# Patient Record
Sex: Male | Born: 1970
Health system: Southern US, Community
[De-identification: ages and names within clinical notes are randomized; demographics above are authoritative.]

## PROBLEM LIST (undated history)

## (undated) DIAGNOSIS — E785 Hyperlipidemia, unspecified: Secondary | ICD-10-CM

## (undated) DIAGNOSIS — R7301 Impaired fasting glucose: Secondary | ICD-10-CM

## (undated) DIAGNOSIS — R718 Other abnormality of red blood cells: Secondary | ICD-10-CM

## (undated) DIAGNOSIS — N529 Male erectile dysfunction, unspecified: Secondary | ICD-10-CM

## (undated) DIAGNOSIS — Z5189 Encounter for other specified aftercare: Secondary | ICD-10-CM

## (undated) DIAGNOSIS — R17 Unspecified jaundice: Secondary | ICD-10-CM

## (undated) HISTORY — DX: Other abnormality of red blood cells: R71.8

## (undated) HISTORY — DX: Unspecified jaundice: R17

## (undated) HISTORY — DX: Hyperlipidemia, unspecified: E78.5

## (undated) HISTORY — DX: Impaired fasting glucose: R73.01

## (undated) HISTORY — DX: Male erectile dysfunction, unspecified: N52.9

## (undated) HISTORY — DX: Encounter for other specified aftercare: Z51.89

---

## 1992-01-02 HISTORY — PX: ORIF FEMUR FRACTURE: SHX2119

## 2000-08-24 ENCOUNTER — Encounter: Payer: Self-pay | Admitting: Emergency Medicine

## 2000-08-24 ENCOUNTER — Emergency Department (HOSPITAL_COMMUNITY): Admission: EM | Admit: 2000-08-24 | Discharge: 2000-08-25 | Payer: Self-pay | Admitting: Emergency Medicine

## 2006-01-01 HISTORY — PX: VASECTOMY: SHX75

## 2007-11-03 ENCOUNTER — Encounter: Admission: RE | Admit: 2007-11-03 | Discharge: 2007-11-03 | Payer: Self-pay | Admitting: Family Medicine

## 2009-06-18 IMAGING — CR DG CHEST 2V
2 series · 2 of 2 positions shown · non-contrast
Comparison: None

CLINICAL DATA: Cough.

CHEST - 2 VIEW

[w chest pa]
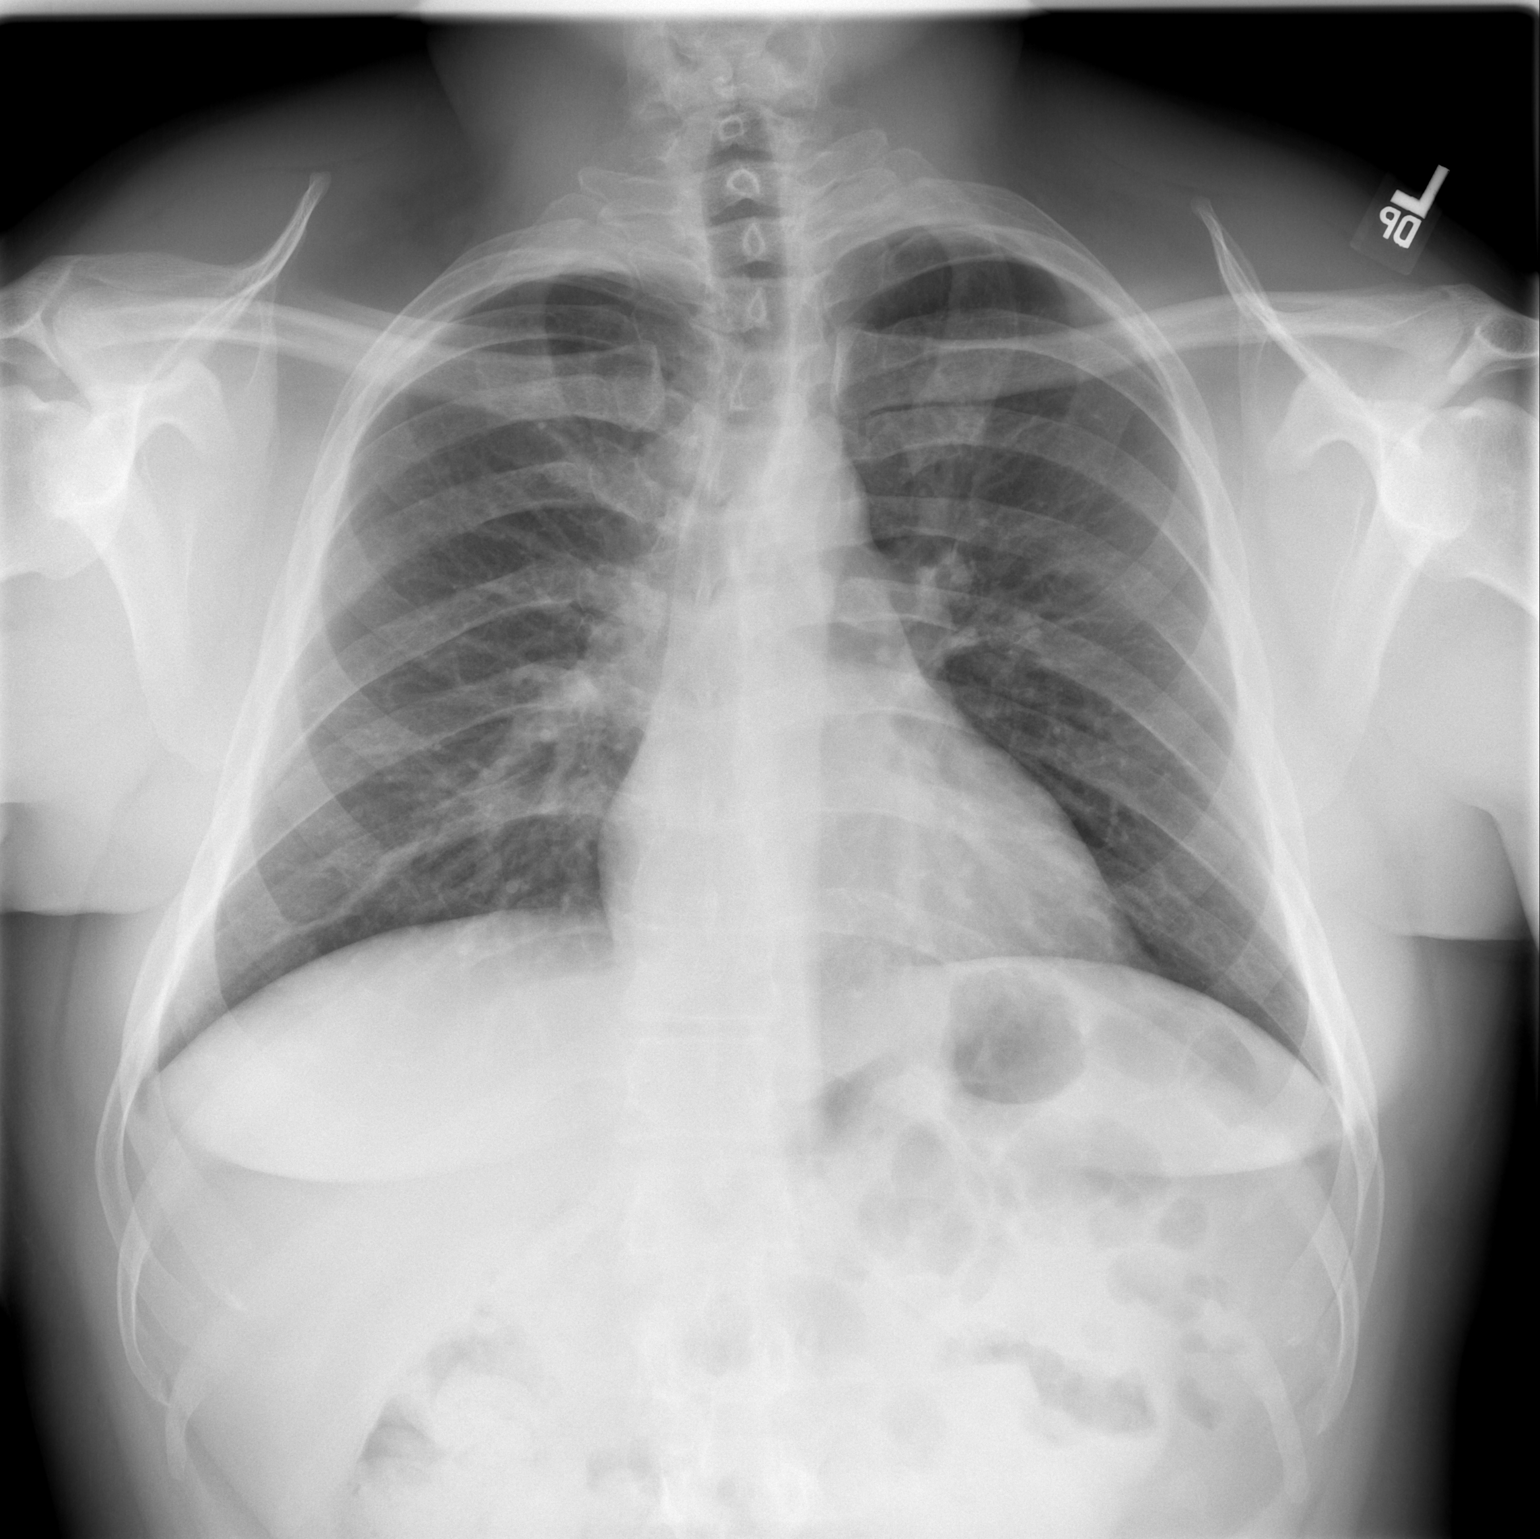

[w chest lat]
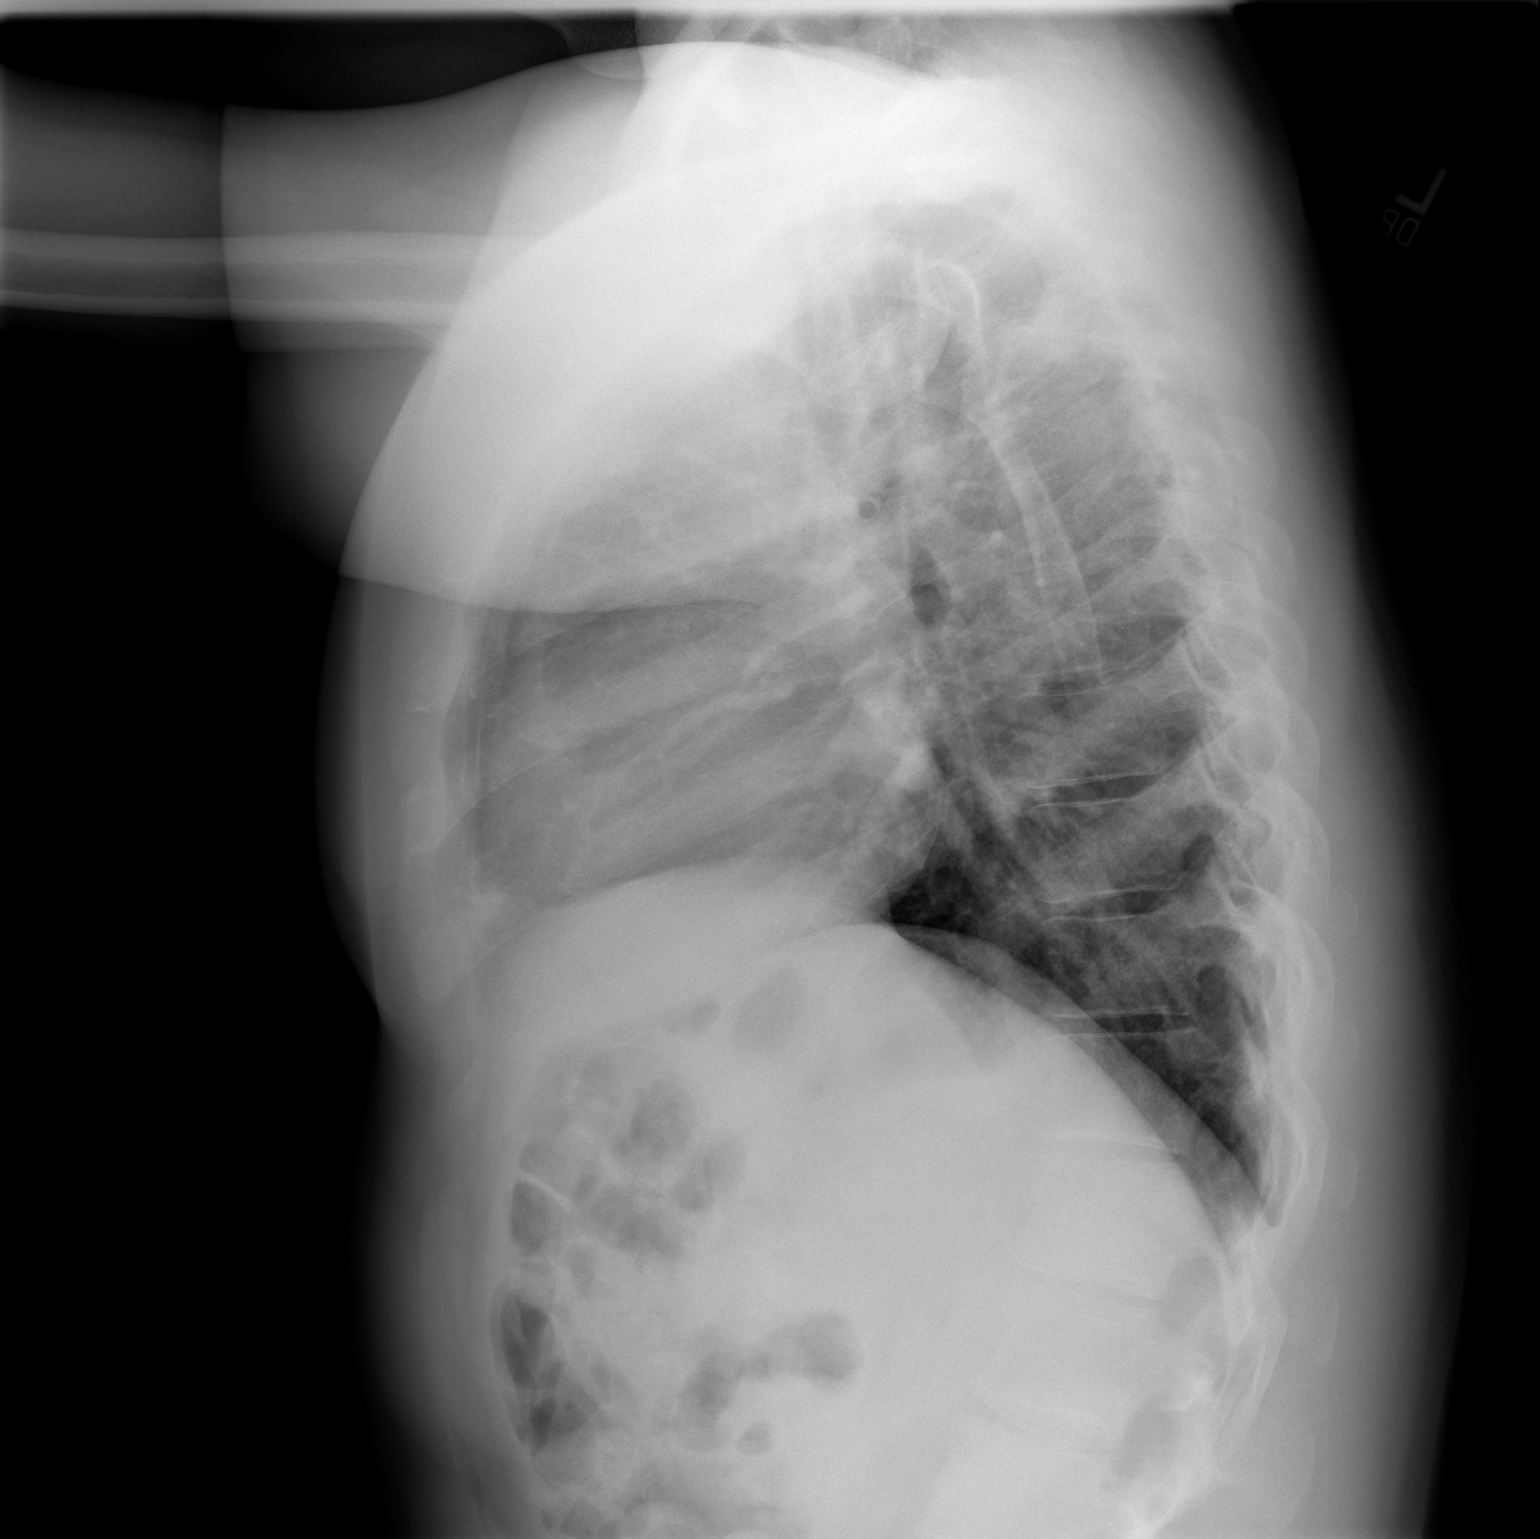

[2 of 2 positions shown; findings below may reference images not displayed]

FINDINGS: Trachea midline.  Heart size normal.  Lungs are clear.
No pleural fluid.
IMPRESSION: No acute findings.

## 2011-03-01 ENCOUNTER — Encounter: Payer: Self-pay | Admitting: Family Medicine

## 2011-03-01 ENCOUNTER — Ambulatory Visit (INDEPENDENT_AMBULATORY_CARE_PROVIDER_SITE_OTHER): Payer: 59 | Admitting: Family Medicine

## 2011-03-01 VITALS — BP 110/70 | HR 60 | Ht 70.5 in | Wt 208.0 lb

## 2011-03-01 DIAGNOSIS — R002 Palpitations: Secondary | ICD-10-CM

## 2011-03-01 DIAGNOSIS — E7800 Pure hypercholesterolemia, unspecified: Secondary | ICD-10-CM

## 2011-03-01 DIAGNOSIS — N529 Male erectile dysfunction, unspecified: Secondary | ICD-10-CM

## 2011-03-01 NOTE — Patient Instructions (Signed)
Cholesterol Control Diet Cholesterol levels in your body are determined significantly by your diet. Cholesterol levels may also be related to heart disease. The following material helps to explain this relationship and discusses what you can do to help keep your heart healthy. Not all cholesterol is bad. Low-density lipoprotein (LDL) cholesterol is the "bad" cholesterol. It may cause fatty deposits to build up inside your arteries. High-density lipoprotein (HDL) cholesterol is "good." It helps to remove the "bad" LDL cholesterol from your blood. Cholesterol is a very important risk factor for heart disease. Other risk factors are high blood pressure, smoking, stress, heredity, and weight. The heart muscle gets its supply of blood through the coronary arteries. If your LDL cholesterol is high and your HDL cholesterol is low, you are at risk for having fatty deposits build up in your coronary arteries. This leaves less room through which blood can flow. Without sufficient blood and oxygen, the heart muscle cannot function properly and you may feel chest pains (angina pectoris). When a coronary artery closes up entirely, a part of the heart muscle may die, causing a heart attack (myocardial infarction). CHECKING CHOLESTEROL When your caregiver sends your blood to a lab to be analyzed for cholesterol, a complete lipid (fat) profile may be done. With this test, the total amount of cholesterol and levels of LDL and HDL are determined. Triglycerides are a type of fat that circulates in the blood and can also be used to determine heart disease risk. The list below describes what the numbers should be: Test: Total Cholesterol.  Less than 200 mg/dl.  Test: LDL "bad cholesterol."  Less than 100 mg/dl.   Less than 70 mg/dl if you are at very high risk of a heart attack or sudden cardiac death.  Test: HDL "good cholesterol."  Greater than 50 mg/dl for women.   Greater than 40 mg/dl for men.  Test:  Triglycerides.  Less than 150 mg/dl.  CONTROLLING CHOLESTEROL WITH DIET Although exercise and lifestyle factors are important, your diet is key. That is because certain foods are known to raise cholesterol and others to lower it. The goal is to balance foods for their effect on cholesterol and more importantly, to replace saturated and trans fat with other types of fat, such as monounsaturated fat, polyunsaturated fat, and omega-3 fatty acids. On average, a person should consume no more than 15 to 17 g of saturated fat daily. Saturated and trans fats are considered "bad" fats, and they will raise LDL cholesterol. Saturated fats are primarily found in animal products such as meats, butter, and cream. However, that does not mean you need to sacrifice all your favorite foods. Today, there are good tasting, low-fat, low-cholesterol substitutes for most of the things you like to eat. Choose low-fat or nonfat alternatives. Choose round or loin cuts of red meat, since these types of cuts are lowest in fat and cholesterol. Chicken (without the skin), fish, veal, and ground turkey breast are excellent choices. Eliminate fatty meats, such as hot dogs and salami. Even shellfish have little or no saturated fat. Have a 3 oz (85 g) portion when you eat lean meat, poultry, or fish. Trans fats are also called "partially hydrogenated oils." They are oils that have been scientifically manipulated so that they are solid at room temperature resulting in a longer shelf life and improved taste and texture of foods in which they are added. Trans fats are found in stick margarine, some tub margarines, cookies, crackers, and baked goods.  When   baking and cooking, oils are an excellent substitute for butter. The monounsaturated oils are especially beneficial since it is believed they lower LDL and raise HDL. The oils you should avoid entirely are saturated tropical oils, such as coconut and palm.  Remember to eat liberally from food  groups that are naturally free of saturated and trans fat, including fish, fruit, vegetables, beans, grains (barley, rice, couscous, bulgur wheat), and pasta (without cream sauces).  IDENTIFYING FOODS THAT LOWER CHOLESTEROL  Soluble fiber may lower your cholesterol. This type of fiber is found in fruits such as apples, vegetables such as broccoli, potatoes, and carrots, legumes such as beans, peas, and lentils, and grains such as barley. Foods fortified with plant sterols (phytosterol) may also lower cholesterol. You should eat at least 2 g per day of these foods for a cholesterol lowering effect.  Read package labels to identify low-saturated fats, trans fats free, and low-fat foods at the supermarket. Select cheeses that have only 2 to 3 g saturated fat per ounce. Use a heart-healthy tub margarine that is free of trans fats or partially hydrogenated oil. When buying baked goods (cookies, crackers), avoid partially hydrogenated oils. Breads and muffins should be made from whole grains (whole-wheat or whole oat flour, instead of "flour" or "enriched flour"). Buy non-creamy canned soups with reduced salt and no added fats.  FOOD PREPARATION TECHNIQUES  Never deep-fry. If you must fry, either stir-fry, which uses very little fat, or use non-stick cooking sprays. When possible, broil, bake, or roast meats, and steam vegetables. Instead of dressing vegetables with butter or margarine, use lemon and herbs, applesauce and cinnamon (for squash and sweet potatoes), nonfat yogurt, salsa, and low-fat dressings for salads.  LOW-SATURATED FAT / LOW-FAT FOOD SUBSTITUTES Meats / Saturated Fat (g)  Avoid: Steak, marbled (3 oz/85 g) / 11 g   Choose: Steak, lean (3 oz/85 g) / 4 g   Avoid: Hamburger (3 oz/85 g) / 7 g   Choose: Hamburger, lean (3 oz/85 g) / 5 g   Avoid: Ham (3 oz/85 g) / 6 g   Choose: Ham, lean cut (3 oz/85 g) / 2.4 g   Avoid: Chicken, with skin, dark meat (3 oz/85 g) / 4 g   Choose: Chicken,  skin removed, dark meat (3 oz/85 g) / 2 g   Avoid: Chicken, with skin, light meat (3 oz/85 g) / 2.5 g   Choose: Chicken, skin removed, light meat (3 oz/85 g) / 1 g  Dairy / Saturated Fat (g)  Avoid: Whole milk (1 cup) / 5 g   Choose: Low-fat milk, 2% (1 cup) / 3 g   Choose: Low-fat milk, 1% (1 cup) / 1.5 g   Choose: Skim milk (1 cup) / 0.3 g   Avoid: Hard cheese (1 oz/28 g) / 6 g   Choose: Skim milk cheese (1 oz/28 g) / 2 to 3 g   Avoid: Cottage cheese, 4% fat (1 cup) / 6.5 g   Choose: Low-fat cottage cheese, 1% fat (1 cup) / 1.5 g   Avoid: Ice cream (1 cup) / 9 g   Choose: Sherbet (1 cup) / 2.5 g   Choose: Nonfat frozen yogurt (1 cup) / 0.3 g   Choose: Frozen fruit bar / trace   Avoid: Whipped cream (1 tbs) / 3.5 g   Choose: Nondairy whipped topping (1 tbs) / 1 g  Condiments / Saturated Fat (g)  Avoid: Mayonnaise (1 tbs) / 2 g   Choose: Low-fat mayonnaise (  1 tbs) / 1 g   Avoid: Butter (1 tbs) / 7 g   Choose: Extra light margarine (1 tbs) / 1 g   Avoid: Coconut oil (1 tbs) / 11.8 g   Choose: Olive oil (1 tbs) / 1.8 g   Choose: Corn oil (1 tbs) / 1.7 g   Choose: Safflower oil (1 tbs) / 1.2 g   Choose: Sunflower oil (1 tbs) / 1.4 g   Choose: Soybean oil (1 tbs) / 2.4 g   Choose: Canola oil (1 tbs) / 1 g  Document Released: 12/18/2004 Document Revised: 08/30/2010 Document Reviewed: 06/08/2010 [employer] Patient Information 2012 [employer]. 
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
## 2011-07-11 ENCOUNTER — Ambulatory Visit (INDEPENDENT_AMBULATORY_CARE_PROVIDER_SITE_OTHER): Payer: 59 | Admitting: Family Medicine

## 2011-07-11 ENCOUNTER — Encounter: Payer: Self-pay | Admitting: Family Medicine

## 2011-07-11 VITALS — BP 110/68 | HR 72 | Ht 70.5 in | Wt 215.0 lb

## 2011-07-11 DIAGNOSIS — H0019 Chalazion unspecified eye, unspecified eyelid: Secondary | ICD-10-CM

## 2011-07-11 DIAGNOSIS — H0011 Chalazion right upper eyelid: Secondary | ICD-10-CM

## 2011-07-11 NOTE — Patient Instructions (Addendum)
Chalazion A chalazion is a swelling or hard lump on the eyelid caused by a blocked oil gland. Chalazions may occur on the upper or the lower eyelid.  CAUSES  Oil gland in the eyelid becomes blocked. SYMPTOMS   Swelling or hard lump on the eyelid. This lump may make it hard to see out of the eye.   The swelling may spread to areas around the eye.  TREATMENT   Although some chalazions disappear by themselves in 1 or 2 months, some chalazions may need to be removed.   Medicines to treat an infection may be required.  HOME CARE INSTRUCTIONS   Wash your hands often and dry them with a clean towel. Do not touch the chalazion.   Apply heat to the eyelid several times a day for 10 minutes to help ease discomfort and bring any yellowish white fluid (pus) to the surface. One way to apply heat to a chalazion is to use the handle of a metal spoon.   Hold the handle under hot water until it is hot, and then wrap the handle in paper towels so that the heat can come through without burning your skin.   Hold the wrapped handle against the chalazion and reheat the spoon handle as needed.   Apply heat in this fashion for 10 minutes, 4 times per day.   Return to your caregiver to have the pus removed if it does not break (rupture) on its own.   Do not try to remove the pus yourself by squeezing the chalazion or sticking it with a pin or needle.   Only take over-the-counter or prescription medicines for pain, discomfort, or fever as directed by your caregiver.  SEEK IMMEDIATE MEDICAL CARE IF:   You have pain in your eye.   Your vision changes.   The chalazion does not go away.   The chalazion becomes painful, red, or swollen, grows larger, or does not start to disappear after 2 weeks.  MAKE SURE YOU:   Understand these instructions.   Will watch your condition.   Will get help right away if you are not doing well or get worse.  Document Released: 12/16/1999 Document Revised: 12/07/2010  Document Reviewed: 04/04/2009 [facility] Patient Information 2012 [place], [place].   Follow up with eye doctor for removal or drainage of the cyst if getting larger and painful.

## 2011-07-11 NOTE — Progress Notes (Signed)
Chief Complaint  Patient presents with  . Eye Problem    right eyelid has a bump on it x 3 weeks. Not painful or itchy.    HPI: Noticed a lump on his right upper eyelid about 3 weeks ago.  Hasn't increased in size, and denies tenderness or pain.  Denies any drainage, vision changes, itchy or watery eyes.  Wears glasses, no contacts. Denies trauma or injury.  Past Medical History  Diagnosis Date  . Erectile dysfunction    History   Social History  . Marital Status: Married    Spouse Name: N/A    Number of Children: 2  . 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 Smoking status: Never Smoker   . Smokeless tobacco: Never Used  . Alcohol Use: No     sober since 03/26/2008 (after DWI)  . Drug Use: No  . Sexually Active: Yes -- Male partner(s)   Other Topics Concern  . Not on file   Social History Narrative   Separated from wife.  Has kids every other weekend (from 1st marriage).  Lives alone, no pets   Current Outpatient Prescriptions on File Prior to Visit  Medication Sig Dispense Refill  . tadalafil (CIALIS) 5 MG tablet Take 5 mg by mouth daily as needed.       Allergies  Allergen Reactions  . Penicillins Other (See Comments)    Historical, told by parent.   PHYSICAL EXAM: BP 110/68  Pulse 72  Ht 5' 10.5" (1.791 m)  Wt 215 lb (97.523 kg)  BMI 30.41 kg/m2 Well developed, pleasant male, in no distress Right upper eyelid, laterally--there is a firm soft tissue swelling in upper eyelid, no fluctuance.  There appears to be a pustule on the under surface of the eyelid, with small area of redness around the pustule (mucosal surface).  There is no erythema or warmth and minimal soft tissue swelling of lid itself.  Conjunctiva is clear  Neck: No lymphadenopathy  ASSESSMENT/PLAN: 1. Chalazion of right upper eyelid    Warm compresses.   Educated re: signs/symptoms of infection.  If significant  worsening, recommend he f/u with ophtho for treatment (either removal or I&D).  Reassured/educated.  Hyperlipidemia--reminded to follow lowfat diet.  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  If remains elevated, will need to discuss in further detail, and arrange for f/u testing.

## 2011-12-11 ENCOUNTER — Encounter: Payer: Self-pay | Admitting: Family Medicine

## 2011-12-11 ENCOUNTER — Encounter: Payer: 59 | Admitting: Family Medicine

## 2011-12-11 ENCOUNTER — Ambulatory Visit (INDEPENDENT_AMBULATORY_CARE_PROVIDER_SITE_OTHER): Payer: 59 | Admitting: Family Medicine

## 2011-12-11 VITALS — BP 130/82 | HR 80 | Temp 98.1°F | Ht 71.0 in | Wt 218.0 lb

## 2011-12-11 DIAGNOSIS — R55 Syncope and collapse: Secondary | ICD-10-CM | POA: Insufficient documentation

## 2011-12-11 DIAGNOSIS — Z0000 Encounter for general adult medical examination without abnormal findings: Secondary | ICD-10-CM

## 2011-12-11 DIAGNOSIS — Z125 Encounter for screening for malignant neoplasm of prostate: Secondary | ICD-10-CM

## 2011-12-11 DIAGNOSIS — N529 Male erectile dysfunction, unspecified: Secondary | ICD-10-CM

## 2011-12-11 DIAGNOSIS — E7800 Pure hypercholesterolemia, unspecified: Secondary | ICD-10-CM

## 2011-12-11 DIAGNOSIS — Z23 Encounter for immunization: Secondary | ICD-10-CM

## 2011-12-11 LAB — COMPREHENSIVE METABOLIC PANEL
ALT: 14 U/L (ref 0–53)
AST: 15 U/L (ref 0–37)
Albumin: 5 g/dL (ref 3.5–5.2)
Alkaline Phosphatase: 41 U/L (ref 39–117)
BUN: 12 mg/dL (ref 6–23)
CO2: 27 mEq/L (ref 19–32)
Calcium: 9.7 mg/dL (ref 8.4–10.5)
Chloride: 98 mEq/L (ref 96–112)
Creat: 0.96 mg/dL (ref 0.50–1.35)
Glucose, Bld: 93 mg/dL (ref 70–99)
Potassium: 3.9 mEq/L (ref 3.5–5.3)
Sodium: 140 mEq/L (ref 135–145)
Total Bilirubin: 1.7 mg/dL — ABNORMAL HIGH (ref 0.3–1.2)
Total Protein: 7.3 g/dL (ref 6.0–8.3)

## 2011-12-11 LAB — CBC WITH DIFFERENTIAL/PLATELET
Basophils Absolute: 0 10*3/uL (ref 0.0–0.1)
Basophils Relative: 0 % (ref 0–1)
Eosinophils Absolute: 0 10*3/uL (ref 0.0–0.7)
Eosinophils Relative: 0 % (ref 0–5)
HCT: 40.5 % (ref 39.0–52.0)
Hemoglobin: 13.4 g/dL (ref 13.0–17.0)
Lymphocytes Relative: 21 % (ref 12–46)
Lymphs Abs: 2 10*3/uL (ref 0.7–4.0)
MCH: 22.9 pg — ABNORMAL LOW (ref 26.0–34.0)
MCHC: 33.1 g/dL (ref 30.0–36.0)
MCV: 69.3 fL — ABNORMAL LOW (ref 78.0–100.0)
Monocytes Absolute: 0.5 10*3/uL (ref 0.1–1.0)
Monocytes Relative: 6 % (ref 3–12)
Neutro Abs: 7.1 10*3/uL (ref 1.7–7.7)
Neutrophils Relative %: 73 % (ref 43–77)
Platelets: 260 10*3/uL (ref 150–400)
RBC: 5.84 MIL/uL — ABNORMAL HIGH (ref 4.22–5.81)
RDW: 15.5 % (ref 11.5–15.5)
WBC: 9.7 10*3/uL (ref 4.0–10.5)

## 2011-12-11 LAB — POCT URINALYSIS DIPSTICK
Bilirubin, UA: NEGATIVE
Blood, UA: NEGATIVE
Glucose, UA: NEGATIVE
Leukocytes, UA: NEGATIVE
Nitrite, UA: NEGATIVE
Protein, UA: NEGATIVE
Spec Grav, UA: 1.01
Urobilinogen, UA: NEGATIVE
pH, UA: 7

## 2011-12-11 LAB — LIPID PANEL
Cholesterol: 195 mg/dL (ref 0–200)
HDL: 38 mg/dL — ABNORMAL LOW (ref >39)
LDL Cholesterol: 142 mg/dL — ABNORMAL HIGH (ref 0–99)
Total CHOL/HDL Ratio: 5.1 Ratio
Triglycerides: 74 mg/dL (ref <150)
VLDL: 15 mg/dL (ref 0–40)

## 2011-12-11 NOTE — Patient Instructions (Signed)
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
## 2011-12-17 ENCOUNTER — Encounter: Payer: 59 | Admitting: Family Medicine

## 2013-10-05 ENCOUNTER — Ambulatory Visit (INDEPENDENT_AMBULATORY_CARE_PROVIDER_SITE_OTHER): Payer: 59 | Admitting: Family Medicine

## 2013-10-05 ENCOUNTER — Encounter: Payer: Self-pay | Admitting: Family Medicine

## 2013-10-05 VITALS — BP 106/60 | HR 60 | Ht 70.5 in | Wt 204.0 lb

## 2013-10-05 DIAGNOSIS — E785 Hyperlipidemia, unspecified: Secondary | ICD-10-CM | POA: Insufficient documentation

## 2013-10-05 DIAGNOSIS — E784 Other hyperlipidemia: Secondary | ICD-10-CM

## 2013-10-05 DIAGNOSIS — R718 Other abnormality of red blood cells: Secondary | ICD-10-CM

## 2013-10-05 DIAGNOSIS — R17 Unspecified jaundice: Secondary | ICD-10-CM

## 2013-10-05 NOTE — Patient Instructions (Signed)
Your total bilirubin is mildly elevated.  There was no significant change from 12/2011 to 01/2013.  I suspect Gilbert's syndrome.  To confirm, would need to check unconjugated vs conjugated bilirubin--see if perhaps this can be done with your upcoming labs in January. (see elevated unconjugated bilirubin with Gilbert's).  This is a benign condition without any significant consequences.  Your bilirubin will ALWAYS be mildly elevated on tests.  Your blood sugar was borderline. Avoid excess sweets and sugar.  Continue regular exercise.  Your HDL remained a little low, while your LDL (bad kind) was improved.  Continue a low cholesterol diet.  Continue daily exercise.  Consider omega-3 fish oil 3000-4000mg /day.  Here are your labs from 12/2011 for comparison.  Lab Results  Component Value Date   WBC 9.7 12/11/2011   HGB 13.4 12/11/2011   HCT 40.5 12/11/2011   MCV 69.3* 12/11/2011   PLT 260 12/11/2011   Lab Results  Component Value Date   CHOL 195 12/11/2011   HDL 38* 12/11/2011   LDLCALC 142* 12/11/2011   TRIG 74 12/11/2011   CHOLHDL 5.1 12/11/2011     Chemistry      Component Value Date/Time   NA 140 12/11/2011 1512   K 3.9 12/11/2011 1512   CL 98 12/11/2011 1512   CO2 27 12/11/2011 1512   BUN 12 12/11/2011 1512   CREATININE 0.96 12/11/2011 1512      Component Value Date/Time   CALCIUM 9.7 12/11/2011 1512   ALKPHOS 41 12/11/2011 1512   AST 15 12/11/2011 1512   ALT 14 12/11/2011 1512   BILITOT 1.7* 12/11/2011 1512     Glucose 93
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
## 2013-10-06 ENCOUNTER — Encounter: Payer: Self-pay | Admitting: Family Medicine

## 2013-10-19 ENCOUNTER — Ambulatory Visit (INDEPENDENT_AMBULATORY_CARE_PROVIDER_SITE_OTHER): Payer: Self-pay | Admitting: Medical

## 2013-10-19 ENCOUNTER — Encounter: Payer: Self-pay | Admitting: Medical

## 2013-10-19 VITALS — BP 120/70 | HR 60 | Temp 98.6°F | Resp 16 | Ht 72.0 in | Wt 208.0 lb

## 2013-10-19 DIAGNOSIS — Z008 Encounter for other general examination: Secondary | ICD-10-CM

## 2013-10-19 DIAGNOSIS — Z0289 Encounter for other administrative examinations: Secondary | ICD-10-CM

## 2013-10-19 LAB — POCT URINALYSIS DIPSTICK
BILIRUBIN UA: NEGATIVE
Blood, UA: NEGATIVE
GLUCOSE UA: NEGATIVE
Ketones, UA: NEGATIVE
Leukocytes, UA: NEGATIVE
NITRITE UA: NEGATIVE
Protein, UA: NEGATIVE
Spec Grav, UA: 1.01
UROBILINOGEN UA: NEGATIVE
pH, UA: 6
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
## 2013-12-17 ENCOUNTER — Ambulatory Visit (INDEPENDENT_AMBULATORY_CARE_PROVIDER_SITE_OTHER): Payer: 59 | Admitting: Family Medicine

## 2013-12-17 ENCOUNTER — Encounter: Payer: Self-pay | Admitting: Family Medicine

## 2013-12-17 VITALS — BP 120/72 | HR 68 | Temp 98.1°F | Ht 71.0 in | Wt 206.0 lb

## 2013-12-17 DIAGNOSIS — D72829 Elevated white blood cell count, unspecified: Secondary | ICD-10-CM

## 2013-12-17 DIAGNOSIS — E785 Hyperlipidemia, unspecified: Secondary | ICD-10-CM

## 2013-12-17 NOTE — Patient Instructions (Signed)
Your elevation in white cell count is minimal, likely due to infection (that you may not have really started having symptoms from yet--can be just a simple cold or virus).  I am not too concerned.  Your WBC last year was normal.  You have no current symptoms of infection.  Return in 2-4 weeks to have this repeated to ensure that it is fine.  Your cholesterol was much better--good job.  Keep up the good diet and exercise.

## 2013-12-17 NOTE — Progress Notes (Signed)
Chief Complaint  Patient presents with  . Follow-up    had labs done through employer for annual exam, white count was 12.0, following up today.    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  His physical is scheduled for mid-January.  He got a call that his WBC was high, and to see PCP to discuss labs. His labs were as follows: WBC 12.0, with diff notable only for absolute gran elevated at 8.4, rest normal. MCV low at 69.4 (prev 70) c-met was normal except for T bili 1.3 (was 1.6 last year).  Glucose 92 (down from 100 last year). Chol 157, TG 73, HDL 40, LDL 102, chol/HDL ratio 3.9  He feels well, and is without complaints. Denies fevers, chills.  Denies URI symptoms.  Denies dysuria, penile discharge, lesions Denies skin lesions, rashes, sores. Denies nausea, vomiting, diarrhea, abdominal pain Denies back pain, joint pains, muscle aches.  PMH, PSH, SH reviewed. Outpatient Encounter Prescriptions as of 12/17/2013  Medication Sig  . tadalafil (CIALIS) 5 MG tablet Take 5 mg by mouth daily as needed.   Allergies  Allergen Reactions  . Penicillins Other (See Comments)    Historical, told by parent.   ROS:  See above  PHYSICAL EXAM: BP 120/72 mmHg  Pulse 68  Temp(Src) 98.1 F (36.7 C) (Tympanic)  Ht 5' 11"  (1.803 m)  Wt 206 lb (93.441 kg)  BMI 28.74 kg/m2 Well developed, pleasant male in no distress HEENT: PERRL, EOMI, conjunctiva clear. TM's and EAC's normal. OP is clear.  Sinuses nontender Neck: no lymphadenopathy or mass Heart: regular rate and rhythm without murmur Lungs: clear bilaterally Back: no CVA tenderness or spinal tenderness Abdomen: soft, nontender, no organomegaly or mass Extremities: no edema Skin: no rashes Psych: normal mood, affect, hygiene and grooming Neuro: alert and oriented. Normal cranial nerves, strength, gait  ASSESSMENT/PLAN:  Elevated WBC count - Plan: CBC with Differential  Dyslipidemia  - improved with diet/exercise  Reviewed current labs and compared to prior. Lipids are improved (ratio is now <4, LDL better) Last year WBC was normal.  Low MCV is stable,  T bili was a little higher last year--known elevation/Gilbert's;stable  Likely elevated WBC is related to mild infection, and not anything worrisome.  Reassured.  Recheck CBC in 3-4 weeks (when WELL)

## 2013-12-29 ENCOUNTER — Encounter: Payer: Self-pay | Admitting: Family Medicine

## 2014-01-06 ENCOUNTER — Other Ambulatory Visit: Payer: 59

## 2014-01-06 DIAGNOSIS — D72829 Elevated white blood cell count, unspecified: Secondary | ICD-10-CM

## 2014-01-07 LAB — CBC WITH DIFFERENTIAL/PLATELET
BASOS PCT: 0 % (ref 0–1)
Basophils Absolute: 0 10*3/uL (ref 0.0–0.1)
EOS ABS: 0 10*3/uL (ref 0.0–0.7)
EOS PCT: 0 % (ref 0–5)
HEMATOCRIT: 39.9 % (ref 39.0–52.0)
Hemoglobin: 13.1 g/dL (ref 13.0–17.0)
Lymphocytes Relative: 37 % (ref 12–46)
Lymphs Abs: 2 10*3/uL (ref 0.7–4.0)
MCH: 23.2 pg — AB (ref 26.0–34.0)
MCHC: 32.8 g/dL (ref 30.0–36.0)
MCV: 70.6 fL — ABNORMAL LOW (ref 78.0–100.0)
MONO ABS: 0.3 10*3/uL (ref 0.1–1.0)
MONOS PCT: 6 % (ref 3–12)
MPV: 9.2 fL (ref 8.6–12.4)
NEUTROS PCT: 57 % (ref 43–77)
Neutro Abs: 3.1 10*3/uL (ref 1.7–7.7)
Platelets: 265 10*3/uL (ref 150–400)
RBC: 5.65 MIL/uL (ref 4.22–5.81)
RDW: 16.2 % — AB (ref 11.5–15.5)
WBC: 5.5 10*3/uL (ref 4.0–10.5)

## 2014-10-27 ENCOUNTER — Encounter: Payer: Self-pay | Admitting: Family Medicine

## 2014-10-27 ENCOUNTER — Ambulatory Visit (INDEPENDENT_AMBULATORY_CARE_PROVIDER_SITE_OTHER): Payer: Commercial Managed Care - HMO | Admitting: Family Medicine

## 2014-10-27 VITALS — BP 118/68 | HR 60 | Ht 70.5 in | Wt 214.0 lb

## 2014-10-27 DIAGNOSIS — D509 Iron deficiency anemia, unspecified: Secondary | ICD-10-CM | POA: Diagnosis not present

## 2014-10-27 DIAGNOSIS — E785 Hyperlipidemia, unspecified: Secondary | ICD-10-CM

## 2014-10-27 DIAGNOSIS — Z23 Encounter for immunization: Secondary | ICD-10-CM

## 2014-10-27 LAB — CBC WITH DIFFERENTIAL/PLATELET
BASOS ABS: 0 10*3/uL (ref 0.0–0.1)
Basophils Relative: 0 % (ref 0–1)
EOS PCT: 1 % (ref 0–5)
Eosinophils Absolute: 0.1 10*3/uL (ref 0.0–0.7)
HEMATOCRIT: 40.3 % (ref 39.0–52.0)
HEMOGLOBIN: 13.3 g/dL (ref 13.0–17.0)
LYMPHS ABS: 2.4 10*3/uL (ref 0.7–4.0)
LYMPHS PCT: 39 % (ref 12–46)
MCH: 23 pg — ABNORMAL LOW (ref 26.0–34.0)
MCHC: 33 g/dL (ref 30.0–36.0)
MCV: 69.7 fL — AB (ref 78.0–100.0)
MONO ABS: 0.4 10*3/uL (ref 0.1–1.0)
MPV: 9.9 fL (ref 8.6–12.4)
Monocytes Relative: 7 % (ref 3–12)
Neutro Abs: 3.2 10*3/uL (ref 1.7–7.7)
Neutrophils Relative %: 53 % (ref 43–77)
Platelets: 257 10*3/uL (ref 150–400)
RBC: 5.78 MIL/uL (ref 4.22–5.81)
RDW: 14.6 % (ref 11.5–15.5)
WBC: 6.1 10*3/uL (ref 4.0–10.5)

## 2014-10-27 LAB — IRON: IRON: 107 ug/dL (ref 50–180)

## 2014-10-27 NOTE — Progress Notes (Signed)
Chief Complaint  Patient presents with  . Follow-up    had labs through employer, 10/06/14 and hgb was 12.8, employee health nurse rec that he follow up on these.    Patient brings in copies of labs done for work from 10/06/2014. Hg 12.8, Hct 39.2, MCV 68.8, plt 262.  c-met was normal except for Tbili 1.8 (has been elevated in the past, we suspected Gilbert's). Chol 177, TG 65, HDL 40, LDL 124, chol/HDL ratio 4.4  He reports that his mother also has anemia (she is in her 58's); unsure of cause. Not being treated for it, but thinks she has taken iron in the past.  He denies any bleeding, bruising, GI complaints, dizziness, fatigue or other complaints.  He also shows me copy of spirometry report (which he gets every other year). Spirometry showed mild restriction (FVC 78% predicted, FEV1 76%, FEV1/FVC 97%. He denies SOB or any chest pressure or symptoms. He has this done every other year at work.  Nothing was mentioned to him, he didn't bring prior studies or know if there was any change.  PMH, PSH, SH reviewed and updated. Social History   Social History  . Marital Status: Married    Spouse Name: N/A  . Number of Children: 2  . Years of Education: N/A   Occupational History  . IT trainer    Social History Main Topics  . Smoking status: Never Smoker   . Smokeless tobacco: Never Used  . Alcohol Use: No     Comment: sober since 03/26/2008 (after DWI)  . Drug Use: No  . Sexual Activity:    Partners: Female    Patent examiner Protection: Surgical     Comment: vasectomy   Other Topics Concern  . Not on file   Social History Narrative   Engaged to be married (10/2014), no date set. Living with fiancee (moved here from [place]). Divorced from 2nd wife. He re-married to his first wife (mother of his 2 children), but then again got divorced.  Lives alone.  Has his 2 girls every other weekend   Outpatient Encounter Prescriptions as of 10/27/2014  Medication Sig  . tadalafil  (CIALIS) 5 MG tablet Take 5 mg by mouth daily as needed.   No facility-administered encounter medications on file as of 10/27/2014.   Allergies  Allergen Reactions  . Penicillins Other (See Comments)    Historical, told by parent.   ROS:  No fever, chills, headaches, dizziness, fatigue, URI symptoms, cough, shortness of breath, chest pain, palpitations, GI or GU complaints, bleeding, bruising, rash, depression or other complaints.  PHYSICAL EXAM: BP 118/68 mmHg  Pulse 60  Ht 5' 10.5" (1.791 m)  Wt 214 lb (97.07 kg)  BMI 30.26 kg/m2  Well developed, pleasant male in no distress Exam was limited to review of labs, counseling/discussion  Comparison to prior CBC's  Lab Results  Component Value Date   WBC 5.5 01/06/2014   HGB 13.1 01/06/2014   HCT 39.9 01/06/2014   MCV 70.6* 01/06/2014   PLT 265 01/06/2014   12/2013: Hg 13.2, MCV 69.4 01/2013: Hg 13.7, MCV 70.5 12/2011: Hg 13.4, MCV 69.3  (12/2013: Lipids: HDL 50, but LDL 102, so ratio 3.9)   ASSESSMENT/PLAN:  Microcytic anemia - Plan: CBC with Differential/Platelet, Ferritin, Iron  Need for prophylactic vaccination and inoculation against influenza - Plan: Flu Vaccine QUAD 36+ mos PF IM (Fluarix & Fluzone Quad PF)    Microcytosis with a very mild anemia.  Review of chart shows consistent  microcytosis dating back to first available labs in 2013.  The Hg had always been >13--this is the first time it is <13, only slightly so.  Recheck CBC along with iron and ferritin.  If iron studies are normal, then suspect a thalassemia, and no further evaluation is needed.  If there is iron deficiency, further evaluation is indicated.  Mild dyslipidemia--chol/HDL ratio >4, whereas it previously had been lower.  Change is related to 20 pt increase in LDL since last check. Reviewed low cholesterol diet. Continue regular exercise.  30 min visit ,more than 1/2 spent counseling.

## 2014-10-27 NOTE — Patient Instructions (Signed)
We are checking your blood to see if there is any iron deficiency. If the tests DO show iron deficiency, then you need to follow the directions and return the hemoccult kit (kit to detect microscopic blood losses from the GI tract). If the tests are normal, you don't need to return them.  We will be in touch with the results soon.

## 2014-10-28 ENCOUNTER — Encounter: Payer: Self-pay | Admitting: Family Medicine

## 2014-10-28 LAB — FERRITIN: Ferritin: 291 ng/mL (ref 22–322)

## 2014-11-02 ENCOUNTER — Encounter: Payer: Self-pay | Admitting: Family Medicine

## 2015-09-22 ENCOUNTER — Encounter: Payer: Self-pay | Admitting: Medical

## 2015-09-22 ENCOUNTER — Ambulatory Visit (INDEPENDENT_AMBULATORY_CARE_PROVIDER_SITE_OTHER): Payer: Self-pay | Admitting: Medical

## 2015-09-22 VITALS — BP 118/80 | HR 68 | Ht 70.5 in | Wt 211.6 lb

## 2015-09-22 DIAGNOSIS — Z0289 Encounter for other administrative examinations: Secondary | ICD-10-CM

## 2015-09-22 LAB — POCT URINALYSIS DIPSTICK
BILIRUBIN UA: NEGATIVE
CLARITY UA: NEGATIVE
Glucose, UA: NEGATIVE
KETONES UA: NEGATIVE
LEUKOCYTES UA: NEGATIVE
Nitrite, UA: NEGATIVE
PH UA: 6
Protein, UA: NEGATIVE
SPEC GRAV UA: 1.02
Urobilinogen, UA: NEGATIVE
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
## 2015-11-09 ENCOUNTER — Telehealth: Payer: Self-pay | Admitting: Family Medicine

## 2015-11-09 NOTE — Telephone Encounter (Signed)
Pt dropped off lab results for you to go over, put in your folder, pt can be reached at [phone] with any questions, pt had his cpe with the fire dept

## 2015-11-10 NOTE — Telephone Encounter (Signed)
Labs reviewed.  To be scanned in.  Compared to 10/2014--overall stable.  Lipids slightly higher (LDL 133, HDL 44, ratio 4.3).  MCV remains low, but normal Hg

## 2015-12-13 ENCOUNTER — Encounter: Payer: Self-pay | Admitting: Family Medicine

## 2016-04-16 ENCOUNTER — Encounter: Payer: Self-pay | Admitting: Family Medicine

## 2016-04-16 ENCOUNTER — Ambulatory Visit (INDEPENDENT_AMBULATORY_CARE_PROVIDER_SITE_OTHER): Payer: Commercial Managed Care - HMO | Admitting: Family Medicine

## 2016-04-16 VITALS — BP 112/64 | HR 64 | Temp 98.1°F | Ht 70.5 in | Wt 214.8 lb

## 2016-04-16 DIAGNOSIS — H00014 Hordeolum externum left upper eyelid: Secondary | ICD-10-CM

## 2016-04-16 MED ORDER — ERYTHROMYCIN 5 MG/GM OP OINT: 1.0000 "application " | TOPICAL_OINTMENT | Freq: Four times a day (QID) | OPHTHALMIC | 0 refills | Status: DC

## 2016-04-16 NOTE — Patient Instructions (Signed)
Stye A stye is a bump on your eyelid caused by a bacterial infection. A stye can form inside the eyelid (internal stye) or outside the eyelid (external stye). An internal stye may be caused by an infected oil-producing gland inside your eyelid. An external stye may be caused by an infection at the base of your eyelash (hair follicle). Styes are very common. Anyone can get them at any age. They usually occur in just one eye, but you may have more than one in either eye. What are the causes? The infection is almost always caused by bacteria called Staphylococcus aureus. This is a common type of bacteria that lives on your skin. What increases the risk? You may be at higher risk for a stye if you have had one before. You may also be at higher risk if you have:  Diabetes.  Long-term illness.  Long-term eye redness.  A skin condition called seborrhea.  High fat levels in your blood (lipids). What are the signs or symptoms? Eyelid pain is the most common symptom of a stye. Internal styes are more painful than external styes. Other signs and symptoms may include:  Painful swelling of your eyelid.  A scratchy feeling in your eye.  Tearing and redness of your eye.  Pus draining from the stye. How is this diagnosed? Your health care provider may be able to diagnose a stye just by examining your eye. The health care provider may also check to make sure:  You do not have a fever or other signs of a more serious infection.  The infection has not spread to other parts of your eye or areas around your eye. How is this treated? Most styes will clear up in a few days without treatment. In some cases, you may need to use antibiotic drops or ointment to prevent infection. Your health care provider may have to drain the stye surgically if your stye is:  Large.  Causing a lot of pain.  Interfering with your vision. This can be done using a thin blade or a needle. Follow these instructions at  home:  Take medicines only as directed by your health care provider.  Apply a clean, warm compress to your eye for 10 minutes, 4 times a day.  Do not wear contact lenses or eye makeup until your stye has healed.  Do not try to pop or drain the stye. Contact a health care provider if:  You have chills or a fever.  Your stye does not go away after several days.  Your stye affects your vision.  Your eyeball becomes swollen, red, or painful. This information is not intended to replace advice given to you by your health care provider. Make sure you discuss any questions you have with your health care provider. Document Released: 09/27/2004 Document Revised: 08/14/2015 Document Reviewed: 04/03/2013 Elsevier Interactive Patient Education  2017 [employer].

## 2016-04-16 NOTE — Progress Notes (Signed)
Chief Complaint  Patient presents with  . Eye Problem    left eye has a bump on it, woke up about a week ago with it there. No pain or itching. No blurring of vision.    A week ago he woke up and noticed a bump on the left upper eyelid.  Denies any significant pain, but it is red.  He hasn't noticed any particular change over the course of the week--not larger, tender or more red or spreading.  He has been applying warm compresses at night.    Denies vision change, eye pain, crusting or other eye complaints.  Denies any changes to medical, social or family history. 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, [place] reviewed  Current Outpatient Prescriptions on File Prior to Visit  Medication Sig Dispense Refill  . tadalafil (CIALIS) 5 MG tablet Take 5 mg by mouth daily as needed.     No current facility-administered medications on file prior to visit.    Allergies  Allergen Reactions  . Penicillins Other (See Comments)    Historical, told by parent.   ROS: no fever, chills, headaches, URI symptoms, itchy or watery eyes, vision changes, sore throat, cough, shortness of breath, chest pain, GI or other skin complaints.  PHYSICAL EXAM:  BP 112/64 (BP Location: Left Arm, Patient Position: Sitting, Cuff Size: Normal)   Pulse 64   Temp 98.1 F (36.7 C) (Tympanic)   Ht 5' 10.5" (1.791 m)   Wt 214 lb 12.8 oz (97.4 kg)   BMI 30.38 kg/m   Well appearing, pleasant male in no distress HEENT: conjunctiva and sclera are clear.  Left upper eyelid--on the lateral aspect there is a soft tissue swelling that is mildly red.  There is no whitehead or pustule evident.  Inverting the lid--no pustule noted, no erythema. Eyelid/lash margin normal OP clear Neck: No lymphadenopathy or mass Neuro: alert and oriented, cranial nerves intact, normal strength, gait Psych: normal mood, affect, hygiene and grooming  ASSESSMENT/PLAN:  Hordeolum externum left upper eyelid  - Plan: erythromycin ophthalmic ointment   No evidence of cellulitis (s/sx reviewed, immediate eval if occurs). Warm compresses at least QID.  Topical erythro ointment (and can use in eye at night when sleeping (on days when not at fire station, as vision will be blurry if gets a call and has to drive).

## 2016-05-02 ENCOUNTER — Encounter: Payer: Self-pay | Admitting: Family Medicine

## 2016-05-02 ENCOUNTER — Ambulatory Visit (INDEPENDENT_AMBULATORY_CARE_PROVIDER_SITE_OTHER): Payer: Commercial Managed Care - HMO | Admitting: Family Medicine

## 2016-05-02 VITALS — BP 124/72 | HR 72 | Temp 97.8°F | Ht 70.5 in | Wt 213.4 lb

## 2016-05-02 DIAGNOSIS — H0014 Chalazion left upper eyelid: Secondary | ICD-10-CM

## 2016-05-02 NOTE — Patient Instructions (Signed)
We are referring you to an eye doctor for removal/treatment of the chalazion (vs external hordeolum) that hasn't resolved with the conservative measures you have been doing.

## 2016-05-02 NOTE — Progress Notes (Signed)
Chief Complaint  Patient presents with  . Stye    on left eyelid-not getting any better, not worse-no pain, some itching. Using the oinment QID and warm compresses.    He was first seen for eye complaint on 4/16.  He presents today for follow-up, not seeing any improvement . He has been doing warm compresses 4x/day.  Has never been painful.  Hasn't drained.  No change in size. He would like this removed--will be graduating and will have pictures taken.  PMH, PSH, SH reviewed  Outpatient Encounter Prescriptions as of 05/02/2016  Medication Sig  . erythromycin ophthalmic ointment Place 1 application into the left eye 4 (four) times daily. Apply externally 4x/day, apply to eye itself at bedtime (when not driving)  . tadalafil (CIALIS) 5 MG tablet Take 5 mg by mouth daily as needed.   No facility-administered encounter medications on file as of 05/02/2016.    Allergies  Allergen Reactions  . Penicillins Other (See Comments)    Historical, told by parent.   ROS: no fever, chills, URI or allergy symptoms, headaches, vision changes.  Denies other skin concerns.  PHYSICAL EXAM:  BP 124/72 (BP Location: Left Arm, Patient Position: Sitting, Cuff Size: Normal)   Pulse 72   Temp 97.8 F (36.6 C) (Tympanic)   Ht 5' 10.5" (1.791 m)   Wt 213 lb 6.4 oz (96.8 kg)   BMI 30.19 kg/m   Pleasant male in no distress Left eye: Soft tissue swelling in a focal area starting at the lateral third of the left upper eyelid.  There is mild redness just over this area.  No extension of erythema or swelling of remainder of eyelid.  There does appear to be some inflammation/head at the eyelash margin. nontender No fluctuance. Conjunctiva is clear Neck: no lymphadenopathy or mass  ASSESSMENT/PLAN:  Chalazion left upper eyelid - vs external hordeolum.  Not resolving--refer to ophtho for treatment. - Plan: Ambulatory referral to Ophthalmology   Suspect chalazion in that it has never really been tender, not  increasing in size or draining. Appearance is more suggestive of external hordeolum, in that there does seem to inflammation at the eyelash margin.  Refer to ophtho for treatment/excision.

## 2016-05-04 DIAGNOSIS — H00024 Hordeolum internum left upper eyelid: Secondary | ICD-10-CM | POA: Diagnosis not present

## 2016-05-04 HISTORY — PX: CHALAZION EXCISION: SHX213

## 2016-05-09 ENCOUNTER — Encounter: Payer: Self-pay | Admitting: Family Medicine

## 2016-06-06 DIAGNOSIS — H00024 Hordeolum internum left upper eyelid: Secondary | ICD-10-CM | POA: Diagnosis not present

## 2016-06-27 DIAGNOSIS — H00024 Hordeolum internum left upper eyelid: Secondary | ICD-10-CM | POA: Diagnosis not present

## 2016-10-02 DIAGNOSIS — Z23 Encounter for immunization: Secondary | ICD-10-CM | POA: Diagnosis not present

## 2016-11-15 ENCOUNTER — Institutional Professional Consult (permissible substitution): Payer: Commercial Managed Care - HMO | Admitting: Family Medicine

## 2016-11-15 ENCOUNTER — Ambulatory Visit: Payer: 59 | Admitting: Family Medicine

## 2016-11-15 ENCOUNTER — Encounter: Payer: Self-pay | Admitting: Family Medicine

## 2016-11-15 VITALS — BP 114/72 | HR 60 | Ht 70.5 in | Wt 213.4 lb

## 2016-11-15 DIAGNOSIS — E7800 Pure hypercholesterolemia, unspecified: Secondary | ICD-10-CM

## 2016-11-15 DIAGNOSIS — R7301 Impaired fasting glucose: Secondary | ICD-10-CM | POA: Insufficient documentation

## 2016-11-15 NOTE — Progress Notes (Signed)
Chief Complaint  Patient presents with  . Follow-up    had labs done through work, glucose fasting was 108 and was told to see PCP and have repeated. Was told by our upfront staff, Juliann Pulse that he did NOT need to be fasting when he brought this paperwork in.    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  These were fasting labs done 11/08/16.  Total cholesterol 209, HDL 46, LDL 143 (133 last year), TG 96 Fasting glucose 108 Rest of c-met normal (T. Bili 1.3) CBC--Hg 13.7, Hct 43.6, MCV 70.9 (noted to be chronically in this range), normal platelets. PSA 0.4   Denies any changes in his diet in the last year. He dropped drinking sodas 8-12 months ago, just rare ginger ale.  He drinks sweet tea once daily with a meal. He drinks a green tea with ginseng and honey. Occasional dessert, not daily.  Exercises 2-3 times/week for an hour, on his days off. Has a gym at work, doesn't exercise while working (doesn't want to get too fatigued in case there is a call).  Typical diet-- Red meat 1x/week.  3-4 eggs/week, with bacon (on weekends). puts pepperjack cheese on sandwiches. +mayo on sandwiches +chipotle ranch dressing on salads   PMH, PSH, SH reviewed  Outpatient Encounter Medications as of 11/15/2016  Medication Sig  . tadalafil (CIALIS) 5 MG tablet Take 5 mg by mouth daily as needed.  . [DISCONTINUED] erythromycin ophthalmic ointment Place 1 application into the left eye 4 (four) times daily. Apply externally 4x/day, apply to eye itself at bedtime (when not driving)   No facility-administered encounter medications on file as of 11/15/2016.    Allergies  Allergen Reactions  . Penicillins Other (See Comments)    Historical, told by parent.   ROS: no fever, chills, headaches, URI symptoms, numbness, tingling, urinary complaints, chest pain, shortness of breath, edema or other concerns  PHYSICAL EXAM:  BP 114/72   Pulse 60   Ht 5' 10.5" (1.791 m)   Wt  213 lb 6.4 oz (96.8 kg)   BMI 30.19 kg/m   Well appearing, pleasant male in no distress Neck: no lymphadenopathy or mass Heart: regular rate and rhythm, no murmur Lungs: clear bilaterally Psych: normal mood, affect, hygiene and grooming Neuro: alert and oriented, cranial nerves intact, normal gait   ASSESSMENT/PLAN:  Impaired fasting glucose - counseled re: diet, exercise, causes including genetic risks for diabetes. return 2-3 mos for FBG and A1c - Plan: Hemoglobin A1c, Glucose, random  Hypercholesterolemia - gradually worsening.  reviewed low cholesterol diet in detail--to cut back on mayo, creamy dressings, egg yolks, bacon. Return in 2-3 mos for repeat lipids - Plan: Lipid panel   Return 2-3 months for fasting labs  Fasting glucose, A1c and lipid panel  25 min visit, more than 1/2 spent counseling   Send copy of notes and results to Dr. Geoffry Paradise

## 2016-11-15 NOTE — Patient Instructions (Addendum)
Cut back on the sugar in your beverages. Try and cut back on your carbs in your diet--have more fruits and vegetables, fewer fries and chips. Don't use mayo on your sandwiches, cut back on cheese and/or eggs and/or bacon.  Use ground [place] or [place] bacon when able to (has less cholesterol than the beef products). Use vinagrette dressings rather than creamy dressings.  Try and get at least 150 minutes of cardio each week--you may need to add a little on your workdays, doesn't have to be very taxing or hard, can just be walking on a treadmill or gentle biking.  Return in 2-3 months for fasting labs--nothing to eat/drink except for water or black coffee for 8 hours prior to the test.  Prediabetes Eating Plan Prediabetes-also called impaired glucose tolerance or impaired fasting glucose-is a condition that causes blood sugar (blood glucose) levels to be higher than normal. Following a healthy diet can help to keep prediabetes under control. It can also help to lower the risk of type 2 diabetes and heart disease, which are increased in people who have prediabetes. Along with regular exercise, a healthy diet:  Promotes weight loss.  Helps to control blood sugar levels.  Helps to improve the way that the body uses insulin.  What do I need to know about this eating plan?  Use the glycemic index (GI) to plan your meals. The index tells you how quickly a food will raise your blood sugar. Choose low-GI foods. These foods take a longer time to raise blood sugar.  Pay close attention to the amount of carbohydrates in the food that you eat. Carbohydrates increase blood sugar levels.  Keep track of how many calories you take in. Eating the right amount of calories will help you to achieve a healthy weight. Losing about 7 percent of your starting weight can help to prevent type 2 diabetes.  You may want to follow a Mediterranean diet. This diet includes a lot of vegetables, lean meats or fish, whole  grains, fruits, and healthy oils and fats. What foods can I eat? Grains Whole grains, such as whole-wheat or whole-grain breads, crackers, cereals, and pasta. Unsweetened oatmeal. Bulgur. Barley. Quinoa. Brown rice. Corn or whole-wheat flour tortillas or taco shells. Vegetables Lettuce. Spinach. Peas. Beets. Cauliflower. Cabbage. Broccoli. Carrots. Tomatoes. Squash. Eggplant. Herbs. Peppers. Onions. Cucumbers. Brussels sprouts. Fruits Berries. Bananas. Apples. Oranges. Grapes. Papaya. Mango. Pomegranate. Kiwi. Grapefruit. Cherries. Meats and Other Protein Sources Seafood. Lean meats, such as chicken and [place] or lean cuts of pork and beef. Tofu. Eggs. Nuts. Beans. Dairy Low-fat or fat-free dairy products, such as yogurt, cottage cheese, and cheese. Beverages Water. Tea. Coffee. Sugar-free or diet soda. Seltzer water. Milk. Milk alternatives, such as soy or almond milk. Condiments Mustard. Relish. Low-fat, low-sugar ketchup. Low-fat, low-sugar barbecue sauce. Low-fat or fat-free mayonnaise. Sweets and Desserts Sugar-free or low-fat pudding. Sugar-free or low-fat ice cream and other frozen treats. Fats and Oils Avocado. Walnuts. Olive oil. The items listed above may not be a complete list of recommended foods or beverages. Contact your dietitian for more options. What foods are not recommended? Grains Refined white flour and flour products, such as bread, pasta, snack foods, and cereals. Beverages Sweetened drinks, such as sweet iced tea and soda. Sweets and Desserts Baked goods, such as cake, cupcakes, pastries, cookies, and cheesecake. The items listed above may not be a complete list of foods and beverages to avoid. Contact your dietitian for more information. This information is not intended to replace  advice given to you by your health care provider. Make sure you discuss any questions you have with your health care provider. Document Released: 05/04/2014 Document Revised:  05/26/2015 Document Reviewed: 01/13/2014 Elsevier Interactive Patient Education  2017 [employer]   Fat and Cholesterol Restricted Diet Getting too much fat and cholesterol in your diet may cause health problems. Following this diet helps keep your fat and cholesterol at normal levels. This can keep you from getting sick. What types of fat should I choose?  Choose monosaturated and polyunsaturated fats. These are found in foods such as olive oil, canola oil, flaxseeds, walnuts, almonds, and seeds.  Eat more omega-3 fats. Good choices include salmon, mackerel, sardines, tuna, flaxseed oil, and ground flaxseeds.  Limit saturated fats. These are in animal products such as meats, butter, and cream. They can also be in plant products such as palm oil, palm kernel oil, and coconut oil.  Avoid foods with partially hydrogenated oils in them. These contain trans fats. Examples of foods that have trans fats are stick margarine, some tub margarines, cookies, crackers, and other baked goods. What general guidelines do I need to follow?  Check food labels. Look for the words "trans fat" and "saturated fat."  When preparing a meal: ? Fill half of your plate with vegetables and green salads. ? Fill one fourth of your plate with whole grains. Look for the word "whole" as the first word in the ingredient list. ? Fill one fourth of your plate with lean protein foods.  Eat more foods that have fiber, like apples, carrots, beans, peas, and barley.  Eat more home-cooked foods. Eat less at restaurants and buffets.  Limit or avoid alcohol.  Limit foods high in starch and sugar.  Limit fried foods.  Cook foods without frying them. Baking, boiling, grilling, and broiling are all great options.  Lose weight if you are overweight. Losing even a small amount of weight can help your overall health. It can also help prevent diseases such as diabetes and heart disease. What foods can I eat? Grains Whole  grains, such as whole wheat or whole grain breads, crackers, cereals, and pasta. Unsweetened oatmeal, bulgur, barley, quinoa, or brown rice. Corn or whole wheat flour tortillas. Vegetables Fresh or frozen vegetables (raw, steamed, roasted, or grilled). Green salads. Fruits All fresh, canned (in natural juice), or frozen fruits. Meat and Other Protein Products Ground beef (85% or leaner), grass-fed beef, or beef trimmed of fat. Skinless chicken or [place]. Ground chicken or [place]. Pork trimmed of fat. All fish and seafood. Eggs. Dried beans, peas, or lentils. Unsalted nuts or seeds. Unsalted canned or dry beans. Dairy Low-fat dairy products, such as skim or 1% milk, 2% or reduced-fat cheeses, low-fat ricotta or cottage cheese, or plain low-fat yogurt. Fats and Oils Tub margarines without trans fats. Light or reduced-fat mayonnaise and salad dressings. Avocado. Olive, canola, sesame, or safflower oils. Natural peanut or almond butter (choose ones without added sugar and oil). The items listed above may not be a complete list of recommended foods or beverages. Contact your dietitian for more options. What foods are not recommended? Grains White bread. White pasta. White rice. Cornbread. Bagels, pastries, and croissants. Crackers that contain trans fat. Vegetables White potatoes. Corn. Creamed or fried vegetables. Vegetables in a cheese sauce. Fruits Dried fruits. Canned fruit in light or heavy syrup. Fruit juice. Meat and Other Protein Products Fatty cuts of meat. Ribs, chicken wings, bacon, sausage, bologna, salami, chitterlings, fatback, hot dogs, bratwurst, and  packaged luncheon meats. Liver and organ meats. Dairy Whole or 2% milk, cream, half-and-half, and cream cheese. Whole milk cheeses. Whole-fat or sweetened yogurt. Full-fat cheeses. Nondairy creamers and whipped toppings. Processed cheese, cheese spreads, or cheese curds. Sweets and Desserts Corn syrup, sugars, honey, and molasses.  Candy. Jam and jelly. Syrup. Sweetened cereals. Cookies, pies, cakes, donuts, muffins, and ice cream. Fats and Oils Butter, stick margarine, lard, shortening, ghee, or bacon fat. Coconut, palm kernel, or palm oils. Beverages Alcohol. Sweetened drinks (such as sodas, lemonade, and fruit drinks or punches). The items listed above may not be a complete list of foods and beverages to avoid. Contact your dietitian for more information. This information is not intended to replace advice given to you by your health care provider. Make sure you discuss any questions you have with your health care provider. Document Released: 06/19/2011 Document Revised: 08/25/2015 Document Reviewed: 03/19/2013 Elsevier Interactive Patient Education  [employer].

## 2016-11-19 ENCOUNTER — Encounter: Payer: Self-pay | Admitting: Family Medicine

## 2017-02-01 ENCOUNTER — Other Ambulatory Visit: Payer: 59

## 2017-02-01 DIAGNOSIS — R7301 Impaired fasting glucose: Secondary | ICD-10-CM | POA: Diagnosis not present

## 2017-02-01 DIAGNOSIS — E7800 Pure hypercholesterolemia, unspecified: Secondary | ICD-10-CM

## 2017-02-01 LAB — HEMOGLOBIN A1C
ESTIMATED AVERAGE GLUCOSE: 111 mg/dL
Hgb A1c MFr Bld: 5.5 % (ref 4.8–5.6)

## 2017-02-01 NOTE — Addendum Note (Signed)
Addended by: Minette Headland A on: 02/01/2017 08:21 AM   Modules accepted: Orders

## 2017-02-02 ENCOUNTER — Encounter: Payer: Self-pay | Admitting: Family Medicine

## 2017-02-02 LAB — GLUCOSE, RANDOM: Glucose: 99 mg/dL (ref 65–99)

## 2017-02-02 LAB — LIPID PANEL
CHOL/HDL RATIO: 4.4 ratio (ref 0.0–5.0)
Cholesterol, Total: 181 mg/dL (ref 100–199)
HDL: 41 mg/dL (ref >39)
LDL CALC: 121 mg/dL — AB (ref 0–99)
TRIGLYCERIDES: 93 mg/dL (ref 0–149)
VLDL Cholesterol Cal: 19 mg/dL (ref 5–40)

## 2017-02-04 ENCOUNTER — Ambulatory Visit: Payer: 59 | Admitting: Family Medicine

## 2017-02-04 ENCOUNTER — Encounter: Payer: Self-pay | Admitting: Family Medicine

## 2017-02-04 VITALS — BP 110/70 | HR 60 | Temp 97.8°F | Ht 70.5 in | Wt 210.2 lb

## 2017-02-04 DIAGNOSIS — B001 Herpesviral vesicular dermatitis: Secondary | ICD-10-CM

## 2017-02-04 DIAGNOSIS — E7800 Pure hypercholesterolemia, unspecified: Secondary | ICD-10-CM | POA: Diagnosis not present

## 2017-02-04 DIAGNOSIS — R7301 Impaired fasting glucose: Secondary | ICD-10-CM

## 2017-02-04 MED ORDER — VALACYCLOVIR HCL 1 G PO TABS
ORAL_TABLET | ORAL | 0 refills | Status: DC
Start: 2017-02-04 — End: 2018-09-23

## 2017-02-04 NOTE — Progress Notes (Signed)
Chief Complaint  Patient presents with  . Blister    has a blister on his lip and now his neck on left side feels swollen. Ate some very hot wings last night and not sure if that had anything to do with it.   . Labs Only    wants to go over labs today.     Lips are often dry, peeling.  He noticed a fever blister 2 days ago, and last night he ate some very spicy wings.  He noticed some swelling in the lip and at his left jaw (under his chin) today.  No known fever, chills, no cold/flu symptoms.  He had labs done recently, due to seeing impaired fasting glucose and higher cholesterol on last check through work. He cut back on carbs/fries, sweet tea/drinks. Cut back some on eggs. Exercise remains the same, about 3x/week.  Letter was mailed earlier today with his labs results and the following info: Lab Results  Component Value Date   HGBA1C 5.5 02/01/2017   Fasting glu 99  Lab Results  Component Value Date   CHOL 181 02/01/2017   HDL 41 02/01/2017   LDLCALC 121 (H) 02/01/2017   TRIG 93 02/01/2017   CHOLHDL 4.4 02/01/2017   "Your fasting sugar is now normal (under 100--it was 108 in November), and there is no evidence of diabetes (normal hemoglobin A1c, the 3 month look at sugars).  Continue to limit your sugars and carbs in your diet.  Your LDL cholesterol is down (better) from November, but unfortunately the HDL dropped some too, so the chol/HDL ratio is still a little elevated.  Be sure to get daily exercise, and continue lowfat, low cholesterol diet."  PMH, PSH, SH reviewed  Current Outpatient Medications on File Prior to Visit  Medication Sig Dispense Refill  . tadalafil (CIALIS) 5 MG tablet Take 5 mg by mouth daily as needed.     No current facility-administered medications on file prior to visit.    Allergies  Allergen Reactions  . Penicillins Other (See Comments)    Historical, told by parent.   ROS; no fever, chills, nausea, vomiting, diarrhea, URI symptoms.  Blister  on lip with swelling as per HPI.  No cough, chest pain, shortness of breath.  PHYSICAL EXAM:  BP 110/70   Pulse 60   Temp 97.8 F (36.6 C) (Tympanic)   Ht 5' 10.5" (1.791 m)   Wt 210 lb 3.2 oz (95.3 kg)   BMI 29.73 kg/m   Wt Readings from Last 3 Encounters:  02/04/17 210 lb 3.2 oz (95.3 kg)  11/15/16 213 lb 6.4 oz (96.8 kg)  05/02/16 213 lb 6.4 oz (96.8 kg)   Well appearing, pleasant male in no distress Lips are slightly dry, some peeling. Lower lip, in the central portion there is an open area (red/denuded), and on the left side there is more crusting and slightly firm.  There is some soft tissue swelling of the lower lip, more laterally but no redness or warmth. No surrounding swelling or erythema extending beyond the lip. OP is clear Neck:  He is slightly tender over the left submandibular salivary gland, but it is not enlarged at all, same as the right one.  No cervical lymphadenopathy  ASSESSMENT/PLAN:  Herpes labialis - Plan: valACYclovir (VALTREX) 1000 MG tablet  Impaired fasting glucose - improved  Hypercholesterolemia - improved, borderline.  continue lowfat, low cholesterol diet.    Take the Valtrex as soon as possible, 2 pills.  Repeat the dose  in 12 hours.  That is the full treatment for a cold sore. There are more in the bottle so that you can use it earlier next time you get a cold sore (the earlier the better).  4 pills total for the treatment course.  Avoid spicy/acidic/salty foods until the cold sore heals. If it gets much worse--increase in pain, swelling, redness, any fever or swollen glands, it could indicate an infection (that is not currently present), and may need antibiotics.  We discussed your labs in detail--overall much better. You will be getting the letter with details in the mail.    Send labs to Dr. Elaina Pattee

## 2017-02-04 NOTE — Patient Instructions (Addendum)
  Take the Valtrex as soon as possible, 2 pills.  Repeat the dose in 12 hours.  That is the full treatment for a cold sore. There are more in the bottle so that you can use it earlier next time you get a cold sore (the earlier the better).  4 pills total for the treatment course.  Avoid spicy/acidic/salty foods until the cold sore heals. If it gets much worse--increase in pain, swelling, redness, any fever or swollen glands, it could indicate an infection (that is not currently present), and may need antibiotics.  We discussed your labs in detail--overall much better. You will be getting the letter with details in the mail.

## 2017-02-06 NOTE — Progress Notes (Signed)
Done

## 2017-08-30 ENCOUNTER — Telehealth: Payer: Self-pay | Admitting: Family Medicine

## 2017-08-30 ENCOUNTER — Ambulatory Visit (INDEPENDENT_AMBULATORY_CARE_PROVIDER_SITE_OTHER): Payer: Self-pay | Admitting: Medical

## 2017-08-30 ENCOUNTER — Encounter: Payer: Self-pay | Admitting: Medical

## 2017-08-30 ENCOUNTER — Encounter: Payer: 59 | Admitting: Medical

## 2017-08-30 VITALS — BP 110/70 | HR 57 | Temp 98.0°F | Resp 16 | Ht 71.0 in | Wt 206.0 lb

## 2017-08-30 DIAGNOSIS — R7301 Impaired fasting glucose: Secondary | ICD-10-CM

## 2017-08-30 DIAGNOSIS — Z0000 Encounter for general adult medical examination without abnormal findings: Secondary | ICD-10-CM

## 2017-08-30 DIAGNOSIS — E7800 Pure hypercholesterolemia, unspecified: Secondary | ICD-10-CM

## 2017-08-30 LAB — POCT URINALYSIS DIP (PROADVANTAGE DEVICE)
BILIRUBIN UA: NEGATIVE
BILIRUBIN UA: NEGATIVE mg/dL
Blood, UA: NEGATIVE
GLUCOSE UA: NEGATIVE mg/dL
LEUKOCYTES UA: NEGATIVE
Nitrite, UA: NEGATIVE
PH UA: 6 (ref 5.0–8.0)
Protein Ur, POC: NEGATIVE mg/dL
Specific Gravity, Urine: 1.015
Urobilinogen, Ur: NEGATIVE
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
## 2017-08-30 NOTE — Telephone Encounter (Signed)
Pt came in for a dot cpe with shane. While at check in he gave me a copy of [facility] he had [facility]. I attached a copy to his chart for today's visit. I also wanted to make sure you got a copy. Sending back a copy for you to review.

## 2017-09-04 ENCOUNTER — Encounter: Payer: Self-pay | Admitting: Family Medicine

## 2017-09-11 NOTE — Telephone Encounter (Signed)
Labs already scanned in.  Lipids slightly higher than on last check with [place] (better than on last year's work eval). Slightly elevated fasting glucose.   Noted.

## 2017-10-09 DIAGNOSIS — Z23 Encounter for immunization: Secondary | ICD-10-CM | POA: Diagnosis not present

## 2018-02-20 ENCOUNTER — Encounter: Payer: Self-pay | Admitting: Family Medicine

## 2018-02-20 ENCOUNTER — Ambulatory Visit: Payer: 59 | Admitting: Family Medicine

## 2018-02-20 VITALS — BP 122/82 | HR 56 | Temp 97.6°F | Ht 71.0 in | Wt 219.6 lb

## 2018-02-20 DIAGNOSIS — B354 Tinea corporis: Secondary | ICD-10-CM

## 2018-02-20 NOTE — Patient Instructions (Addendum)
  I recommend using Lamisil once  daily or Lotrimin (clotrimazole) twice daily for at least 2-4 weeks.  These are over-the-counter creams, found in the athlete's foot section.  If it does NOT respond at all to treatment, there are other possible diagnoses, but this seems most likely and most common.     Body Ringworm Body ringworm is an infection of the skin that often causes a ring-shaped rash. Body ringworm can affect any part of your skin. It can spread easily to others. Body ringworm is also called tinea corporis. What are the causes? This condition is caused by funguses called dermatophytes. The condition develops when these funguses grow out of control on the skin. You can get this condition if you touch a person or animal that has it. You can also get it if you share clothing, bedding, towels, or any other object with an infected person or pet. What increases the risk? This condition is more likely to develop in:  Athletes who often make skin-to-skin contact with other athletes, such as wrestlers.  People who share equipment and mats.  People with a weakened immune system. What are the signs or symptoms? Symptoms of this condition include:  Itchy, raised red spots and bumps.  Red scaly patches.  A ring-shaped rash. The rash may have: ? A clear center. ? Scales or red bumps at its center. ? Redness near its borders. ? Dry and scaly skin on or around it. How is this diagnosed? This condition can usually be diagnosed with a skin exam. A skin scraping may be taken from the affected area and examined under a microscope to see if the fungus is present. How is this treated? This condition may be treated with:  An antifungal cream or ointment.  An antifungal shampoo.  Antifungal medicines. These may be prescribed if your ringworm is severe, keeps coming back, or lasts a long time. Follow these instructions at home:  Take over-the-counter and prescription medicines only as  told by your health care provider.  If you were given an antifungal cream or ointment: ? Use it as told by your health care provider. ? Wash the infected area and dry it completely before applying the cream or ointment.  If you were given an antifungal shampoo: ? Use it as told by your health care provider. ? Leave the shampoo on your body for 3-5 minutes before rinsing.  While you have a rash: ? Wear loose clothing to stop clothes from rubbing and irritating it. ? Wash or change your bed sheets every night.  If your pet has the same infection, take your pet to see a Animal nutritionist. How is this prevented?  Practice good hygiene.  Wear sandals or shoes in public places and showers.  Do not share personal items with others.  Avoid touching red patches of skin on other people.  Avoid touching pets that have bald spots.  If you touch an animal that has a bald spot, wash your hands. Contact a health care provider if:  Your rash continues to spread after 7 days of treatment.  Your rash is not gone in 4 weeks.  The area around your rash gets red, warm, tender, and swollen. This information is not intended to replace advice given to you by your health care provider. Make sure you discuss any questions you have with your health care provider. Document Released: 12/16/1999 Document Revised: 05/26/2015 Document Reviewed: 10/14/2014 Elsevier Interactive Patient Education  2019 [employer].

## 2018-02-20 NOTE — Progress Notes (Signed)
Chief Complaint  Patient presents with  . Rash    on left arm x 2, has tried aquafor but not helped. Itchy when he showers from the hot water, but not during the day.    Patient presents with rash on his left upper arm that he first noted 3-4 weeks ago.  There are two spots.  It is itchy only when showering.  No known contacts with similar rash. Aquaphor doesn't seem to make any difference.  PMH, PSH, SH reviewed  Outpatient Encounter Medications as of 02/20/2018  Medication Sig Note  . tadalafil (CIALIS) 5 MG tablet Take 5 mg by mouth daily as needed. 02/20/2018: Uses prn  . valACYclovir (VALTREX) 1000 MG tablet Take 2 tablets at earliest onset of cold sore and repeat once 12 hours later (4 tablets/course) (Patient not taking: Reported on 08/30/2017)    No facility-administered encounter medications on file as of 02/20/2018.    Allergies  Allergen Reactions  . Penicillins Other (See Comments)    Historical, told by parent.   ROS: no fever, chills, URI symptoms, other skin rashes or concerns.  PHYSICAL EXAM:  BP 122/82   Pulse (!) 56   Temp 97.6 F (36.4 C) (Tympanic)   Ht 5\' 11"  (1.803 m)   Wt 219 lb 9.6 oz (99.6 kg) Comment: with boots-did not want to take off  BMI 30.63 kg/m   Wt Readings from Last 3 Encounters:  02/20/18 219 lb 9.6 oz (99.6 kg)  08/30/17 206 lb (93.4 kg)  02/04/17 210 lb 3.2 oz (95.3 kg)   Wearing steel toed boots for his weight today. (usually takes them off for weights).  Well appearing, pleasant male in no distress Skin: Left upper arm--below his tattoo there is a circular area with raised borders and central clearing measuring 1 cm in size. Within the tattoo there is a larger area (hard to see), but has raised edges, not quite round (more scalloped), with central clearing, measuring 2 cm in size  ASSESSMENT/PLAN:  Tinea corporis    I recommend using Lamisil once  daily or Lotrimin (clotrimazole) twice daily for at least 2-4 weeks.  These are  over-the-counter creams, found in the athlete's foot section.  If it does NOT respond at all to treatment, there are other possible diagnoses, but this seems most likely and most common.

## 2018-09-22 ENCOUNTER — Encounter: Payer: Self-pay | Admitting: Family Medicine

## 2018-09-22 ENCOUNTER — Ambulatory Visit: Payer: 59 | Admitting: Family Medicine

## 2018-09-22 ENCOUNTER — Other Ambulatory Visit: Payer: Self-pay

## 2018-09-22 VITALS — Temp 99.9°F | Ht 71.0 in | Wt 210.0 lb

## 2018-09-22 DIAGNOSIS — R509 Fever, unspecified: Secondary | ICD-10-CM | POA: Diagnosis not present

## 2018-09-22 DIAGNOSIS — J020 Streptococcal pharyngitis: Secondary | ICD-10-CM

## 2018-09-22 MED ORDER — AZITHROMYCIN 250 MG PO TABS: ORAL_TABLET | ORAL | 0 refills | Status: DC

## 2018-09-22 NOTE — Progress Notes (Signed)
done

## 2018-09-22 NOTE — Progress Notes (Signed)
Start time: 10:31 End time: 10:53   Virtual Visit via Video Note  I connected with Paul Taylor on 09/22/18 by a video enabled telemedicine application and verified that I am speaking with the correct person using two identifiers.  Location: Patient: home, wife is present Provider: office   I discussed the limitations of evaluation and management by telemedicine and the availability of in person appointments. The patient expressed understanding and agreed to proceed.  History of Present Illness:  Chief Complaint  Patient presents with  . Sore Throat    started Friday. His gums were sore Tuesday from dental visit (cleaning). Fever started Friday as well. Has gone up to as high as 102 by the evening. Had chills Friday night and Sat. No cough or runny nose. Was taking DayQuil/NyQuil Friday and Saturday but stopped-not taking any meds currently.    Sore throat started 3 days ago, and had fever up to 102 on Friday night.  He felt very tired.  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  He had COVID testing earlier this morning, was told results would take 3-4 days.  He had a routine dental cleaning earlier in September (first week). A week later he had right sided gum soreness. Gums are still sore, towards the back on the right.  A little better, not worse, but still sore.  Current symptoms include right sided sore throat, tender to touch on the bottom of his jaw on the right side.  He has had continued fevers, which increases as the day progresses. Still has some soreness at the right posterior gums.  He denies any runny nose, sneezing, postnasal drainage or cough.  PMH, PSH, SH reviewed  Outpatient Encounter Medications as of 09/22/2018  Medication Sig Note  . tadalafil (CIALIS) 5 MG tablet Take 5 mg by mouth daily as needed. 02/20/2018: Uses prn  . valACYclovir (VALTREX) 1000 MG tablet Take 2 tablets at earliest onset of cold sore and repeat once 12 hours later (4  tablets/course) (Patient not taking: Reported on 08/30/2017)    No facility-administered encounter medications on file as of 09/22/2018.    Allergies  Allergen Reactions  . Penicillins Other (See Comments)    Historical, told by parent.   ROS: no cough, shortness of breath, chest pain, runny nose or ear pain.  R sided gum pain, sore throat, and fever per HPI. No vomiting, diarrhea, rash or other complaints. See HPI    Observations/Objective:  Temp 99.9 F (37.7 C) (Oral)   Ht 5\' 11"  (1.803 m)   Wt 210 lb (95.3 kg)   BMI 29.29 kg/m   Well-appearing male, slightly anxious, but in no distress.  He is speaking easily, doesn't appear to be in significant discomfort.  He doesn't sound nasal, no cough, throat-clearing during visit. OP: He had erythema R>L with white spot noted over the right tonsil. His area of tenderness is at the angle of the jaw on the right, states he feels a knot which is tender. No visible swelling or abnormal findings noted at neck through video.  He is alert, oriented, cranial nerves are grossly intact.  Assessment and Plan:  Pharyngitis due to Streptococcus species - Plan: azithromycin (ZITHROMAX) 250 MG tablet  Fever, unspecified fever cause  Suspected strep throat based on exam over video with erythema and exudate on the right.  Ddx reviewed, including viral infections. To contact us if symptoms persist/worsen. Supportive measures reviewed (tylenol, ibuprofen, salt water gargles).  He had COVID  testing earlier today through his job.  Denies any known exposures.  Follow Up Instructions:    I discussed the assessment and treatment plan with the patient. The patient was provided an opportunity to ask questions and all were answered. The patient agreed with the plan and demonstrated an understanding of the instructions.   The patient was advised to call back or seek an in-person evaluation if the symptoms worsen or if the condition fails to improve as  anticipated.  I provided 22 minutes of non-face-to-face time during this encounter.   Vikki Ports, MD

## 2018-09-22 NOTE — Patient Instructions (Signed)
Start the antibiotics and take them as directed. Your fever and throat should be feeling better within 24-48 hours of starting the antibiotics if it is from strep throat (which it looked like over the video). We talked about changing out your toothbrush after 24 hours on antibiotics.  Please contact us if you have persistent fevers, worsening sore throat, new symptoms (sinus pain, discolored drainage), cough, shortness of breath, rash or other concerns.   Strep Throat, Adult Strep throat is an infection of the throat. It is caused by germs (bacteria). Strep throat is common during the cold months of the year. It mostly affects children who are 75-59 years old. However, people of all ages can get it at any time of the year. When strep throat affects the tonsils, it is called tonsillitis. When it affects the back of the throat, it is called pharyngitis. This infection spreads from person to person through coughing, sneezing, or having close contact. What are the causes? This condition is caused by the Streptococcus pyogenes germ. What increases the risk? You are more likely to develop this condition if:  You care for young children. Children are more likely to get strep throat and may spread it to others.  You go to crowded places. Germs can spread easily in such places.  You kiss or touch someone who has strep throat. What are the signs or symptoms? Symptoms of this condition include:  Fever or chills.  Redness, swelling, or pain in the tonsils or throat.  Pain or trouble when swallowing.  White or yellow spots on the tonsils or throat.  Tender glands in the neck and under the jaw.  Bad breath.  Red rash all over the body. This is rare. How is this treated? This condition may be treated with:  Medicines that kill germs (antibiotics).  Medicines that treat pain or fever. These include: ? Ibuprofen or acetaminophen. ? Aspirin, only for patients who are over the age of 73. ?  Throat lozenges. ? Throat sprays. Follow these instructions at home: Medicines   Take over-the-counter and prescription medicines only as told by your doctor.  Take your antibiotic medicine as told by your doctor. Do not stop taking the antibiotic even if you start to feel better. Eating and drinking   If you have trouble swallowing, eat soft foods until your throat feels better.  Drink enough fluid to keep your pee (urine) pale yellow.  To help with pain, you may have: ? Warm fluids, such as soup and tea. ? Cold fluids, such as frozen desserts or popsicles. General instructions  Rinse your mouth (gargle) with a salt-water mixture 3-4 times a day or as needed. To make a salt-water mixture, dissolve -1 tsp (3-6 g) of salt in 1 cup (237 mL) of warm water.  Rest as much as you can.  Stay home from work or school until you have been taking antibiotics for 24 hours.  Avoid smoking or being around people who smoke.  Keep all follow-up visits as told by your doctor. This is important. How is this prevented?   Do not share food, drinking cups, or personal items. They can cause the germs to spread.  Wash your hands well with soap and water. Make sure that all people in your house wash their hands well.  Have family members tested if they have a fever or a sore throat. They may need an antibiotic if they have strep throat. Contact a doctor if:  You have swelling in your  neck that keeps getting bigger.  You get a rash, cough, or earache.  You cough up a thick fluid that is green, yellow-brown, or bloody.  You have pain that does not get better with medicine.  Your symptoms get worse instead of getting better.  You have a fever. Get help right away if:  You vomit.  You have a very bad headache.  Your neck hurts or feels stiff.  You have chest pain or are short of breath.  You have drooling, very bad throat pain, or changes in your voice.  Your neck is swollen, or  the skin gets red and tender.  Your mouth is dry, or you are peeing less than normal.  You keep feeling more tired or have trouble waking up.  Your joints are red or painful. Summary  Strep throat is an infection of the throat. It is caused by germs (bacteria).  This infection can spread from person to person through coughing, sneezing, or having close contact.  Take your medicines, including antibiotics, as told by your doctor. Do not stop taking the antibiotic even if you start to feel better.  To prevent the spread of germs, wash your hands well with soap and water. Have others do the same. Do not share food, drinking cups, or personal items.  Get help right away if you have a bad headache, chest pain, shortness of breath, a stiff or painful neck, or you vomit. This information is not intended to replace advice given to you by your health care provider. Make sure you discuss any questions you have with your health care provider. Document Released: 06/06/2007 Document Revised: 03/07/2018 Document Reviewed: 03/07/2018 Elsevier Patient Education  [place].

## 2018-09-23 ENCOUNTER — Telehealth: Payer: Self-pay | Admitting: Internal Medicine

## 2018-09-23 DIAGNOSIS — B001 Herpesviral vesicular dermatitis: Secondary | ICD-10-CM

## 2018-09-23 MED ORDER — VALACYCLOVIR HCL 1 G PO TABS: ORAL_TABLET | ORAL | 0 refills | Status: DC

## 2018-09-23 NOTE — Telephone Encounter (Signed)
Advised patient of same. ?

## 2018-09-23 NOTE — Telephone Encounter (Signed)
Pt called and states his covid test was negative. His fever is gone and Sore throat is getting better but he now has a cough and develped cold sores on the bottom of his lip last night. He went and got robitussin otc and and Abreva for the cold sore. He did not have any valtrex let. Wanted to know if that was okay to use while he is on a zpak.

## 2018-09-23 NOTE — Telephone Encounter (Signed)
Yes, okay to use these medications with z-pak.  I sent in a refill of his Valtrex to his pharmacy, please advise pt.  Thanks

## 2019-03-05 ENCOUNTER — Encounter: Payer: Self-pay | Admitting: Family Medicine

## 2019-03-05 ENCOUNTER — Ambulatory Visit: Payer: 59 | Admitting: Family Medicine

## 2019-03-05 ENCOUNTER — Other Ambulatory Visit: Payer: Self-pay

## 2019-03-05 VITALS — BP 110/60 | HR 68 | Temp 98.4°F | Ht 71.0 in | Wt 215.6 lb

## 2019-03-05 DIAGNOSIS — B354 Tinea corporis: Secondary | ICD-10-CM

## 2019-03-05 NOTE — Progress Notes (Signed)
Chief Complaint  Patient presents with  . Rash    same as rash from 02/20/18. Has used OTC lotrimin, works but comes back. Upper left arm and lower right. It does itch from time to time.    Seen last year with similar complaint on the L arm only.  He used lotrimin for about 2 weeks and the rash resolved. He recalls it came back about a month later, used the cream again, resolved.  Just noticed the one on his right arm about 6 months ago. He tried lotrimin on the R, but it never really resolved. Still came back on the L upper arm as well.  Rash on the L arm (by tattoo) recurred about 2-3 weeks ago.  They are both itchy, only with hot showers, not itchy other times of the day.  No known contacts with anyone with rashes, has no pets. 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  Does use gym equipment.   PMH, PSH, SH reviewed Not currently taking medications (has cialis and valtrex for prn use)  Allergies  Allergen Reactions  . Penicillins Other (See Comments)    Historical, told by parent.   ROS: no fever, chills, URI symptoms, headaches, chest pain, bleeding, bruising.  Rash per HPI.  No other complaints. Moods are good.   PHYSICAL EXAM:  BP 110/60   Pulse 68   Temp 98.4 F (36.9 C) (Other (Comment))   Ht 5\' 11"  (1.803 m)   Wt 215 lb 9.6 oz (97.8 kg)   BMI 30.07 kg/m   Well-appearing, pleasant male, in good spirits. Skin:  L upper arm Within the tattoo there is a 2 x 1.5 cm hyperkeratotic lesion--edges are raised, scaly, but has central clearing. Smaller lesion within the curve before the "B" measures 6-40mm, and it rough/hyperkeratotic, no central clearing noted.  R upper forearm 3.5 x 1.75cm irregularly shaped area that is slighty pink, with 2 separate areas of raised curvilinear portions. These are narrower and different than the raised areas on the L arm  ASSESSMENT/PLAN:  Tinea corporis - recurrent in same area.  Switch to Lamisil  x 2-4 wks.  wipe down gym equipment before and after use.  If continues to recur, consider oral antifungal treatment  He has labs scheduled for work, and then schedules physical with them. He will schedule visit here afterwards, to review any abnormal labs if he has any questions or concerns (as he has done in the past); can also serve as a visit to recheck this rash to ensure resolution.    Use Lamisil cream daily to the affected areas of skin. Be sure to continue the use at least 1 week beyond when you think it has resolved. (typically needs 2-4 weeks).  If it resolves, but comes back, we can consider trying an oral treatment.  I'd like to see your blood tests results from upcoming labs before considering this (to ensure that liver tests are normal)

## 2019-03-05 NOTE — Patient Instructions (Addendum)
Use Lamisil cream daily to the affected areas of skin. Be sure to continue the use at least 1 week beyond when you think it has resolved. (typically needs 2-4 weeks).  If it resolves, but comes back, we can consider trying an oral treatment.  I'd like to see your blood tests results from upcoming labs before considering this (to ensure that liver tests are normal)   Body Ringworm Body ringworm is an infection of the skin that often causes a ring-shaped rash. Body ringworm is also called tinea corporis. Body ringworm can affect any part of your skin. This condition is easily spread from person to person (is very contagious). What are the causes? This condition is caused by fungi called dermatophytes. The condition develops when these fungi grow out of control on the skin. You can get this condition if you touch a person or animal that has it. You can also get it if you share any items with an infected person or pet. These include:  Clothing, bedding, and towels.  Brushes or combs.  Gym equipment.  Any other object that has the fungus on it. What increases the risk? You are more likely to develop this condition if you:  Play sports that involve close physical contact, such as wrestling.  Sweat a lot.  Live in areas that are hot and humid.  Use public showers.  Have a weakened immune system. What are the signs or symptoms? Symptoms of this condition include:  Itchy, raised red spots and bumps.  Red scaly patches.  A ring-shaped rash. The rash may have: ? A clear center. ? Scales or red bumps at its center. ? Redness near its borders. ? Dry and scaly skin on or around it. How is this diagnosed? This condition can usually be diagnosed with a skin exam. A skin scraping may be taken from the affected area and examined under a microscope to see if the fungus is present. How is this treated? This condition may be treated with:  An antifungal cream or ointment.  An  antifungal shampoo.  Antifungal medicines. These may be prescribed if your ringworm: ? Is severe. ? Keeps coming back. ? Lasts a long time. Follow these instructions at home:  Take over-the-counter and prescription medicines only as told by your health care provider.  If you were given an antifungal cream or ointment: ? Use it as told by your health care provider. ? Wash the infected area and dry it completely before applying the cream or ointment.  If you were given an antifungal shampoo: ? Use it as told by your health care provider. ? Leave the shampoo on your body for 3-5 minutes before rinsing.  While you have a rash: ? Wear loose clothing to stop clothes from rubbing and irritating it. ? Wash or change your bed sheets every night. ? Disinfect or throw out items that may be infected. ? Wash clothes and bed sheets in hot water. ? Wash your hands often with soap and water. If soap and water are not available, use hand sanitizer.  If your pet has the same infection, take your pet to see a veterinarian for treatment. How is this prevented?  Take a bath or shower every day and after every time you work out or play sports.  Dry your skin completely after bathing.  Wear sandals or shoes in public places and showers.  Change your clothes every day.  Wash athletic clothes after each use.  Do not share personal items  with others.  Avoid touching red patches of skin on other people.  Avoid touching pets that have bald spots.  If you touch an animal that has a bald spot, wash your hands. Contact a health care provider if:  Your rash continues to spread after 7 days of treatment.  Your rash is not gone in 4 weeks.  The area around your rash gets red, warm, tender, and swollen. Summary  Body ringworm is an infection of the skin that often causes a ring-shaped rash.  This condition is easily spread from person to person (is very contagious).  This condition may be  treated with antifungal cream or ointment, antifungal shampoo, or antifungal medicines.  Take over-the-counter and prescription medicines only as told by your health care provider. This information is not intended to replace advice given to you by your health care provider. Make sure you discuss any questions you have with your health care provider. Document Revised: 08/16/2017 Document Reviewed: 08/16/2017 Elsevier Patient Education  [place].

## 2019-04-16 ENCOUNTER — Other Ambulatory Visit: Payer: Self-pay

## 2019-04-16 ENCOUNTER — Ambulatory Visit: Payer: 59 | Admitting: Family Medicine

## 2019-04-16 ENCOUNTER — Encounter: Payer: Self-pay | Admitting: Family Medicine

## 2019-04-16 VITALS — BP 108/60 | HR 60 | Temp 97.0°F | Ht 71.0 in | Wt 220.0 lb

## 2019-04-16 DIAGNOSIS — R7301 Impaired fasting glucose: Secondary | ICD-10-CM

## 2019-04-16 DIAGNOSIS — B354 Tinea corporis: Secondary | ICD-10-CM

## 2019-04-16 DIAGNOSIS — E7800 Pure hypercholesterolemia, unspecified: Secondary | ICD-10-CM | POA: Diagnosis not present

## 2019-04-16 MED ORDER — TERBINAFINE HCL 250 MG PO TABS: 250.0000 mg | ORAL_TABLET | Freq: Every day | ORAL | 0 refills | Status: DC

## 2019-04-16 NOTE — Progress Notes (Signed)
Chief Complaint  Patient presents with  . Rash    follow up on rash, left arm better and right arm about the same. Brought in labs from [employer] [facility].    Rash on the left arm improved some, but still present. Still has a spot on the right forearm as well. He has been using lamisil cream once daily since his last visit, 3/4. Still itchy when he takes a hot shower.  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  Lipids: TC 211, HDL 37, LDL 157, TG 72, chol/HDL ratio 5.7 (HDL 45 and LDL 133 in 08/2017)  Fasting glucose 102 (103 in 08/2017)  Rest of chem panel normal, except T bili 1.4 (stable, down from 1.9 on last check 08/2017) CBC--normal WBC 6.1, Hgb 13.8, MCV 71.6 (MCV was <70 in 08/2017) PSA 0.4  Denies any significant changes to his diet. Eats 2-3 eggs and bacon just once a week on Sundays. Red meat 3-4x/week (burgers), some pork. +Creamy dressings [facility]).  Some cheese on sandwiches only. Sweet tea at lunch, regular soda (ginger ale) with dinner, orange juice with breakfast.  Used to exercise 3-4x/week, now down to 2x/week due to his part-time job taking up more hours.  PMH, PSH, SH reviewed  Outpatient Encounter Medications as of 04/16/2019  Medication Sig Note  . tadalafil (CIALIS) 5 MG tablet Take 5 mg by mouth daily as needed. 02/20/2018: Uses prn  . terbinafine (LAMISIL) 250 MG tablet Take 1 tablet (250 mg total) by mouth daily.   . valACYclovir (VALTREX) 1000 MG tablet Take 2 tablets at earliest onset of cold sore and repeat once 12 hours later (4 tablets/course) (Patient not taking: Reported on 03/05/2019)    No facility-administered encounter medications on file as of 04/16/2019.   (NOT taking oral lamisil prior to today's visit; was using topical)  Allergies  Allergen Reactions  . Penicillins Other (See Comments)    Historical, told by parent.    ROS:  No fever, chills, URI symptoms, chest pain, palpitations,  shortness of breath.  Rash per HPI.  No bleeding, bruising, moods are good. No other concerns.   PHYSICAL EXAM:  BP 108/60   Pulse 60   Temp (!) 97 F (36.1 C) (Tympanic)   Ht 5\' 11"  (1.803 m)   Wt 220 lb (99.8 kg)   BMI 30.68 kg/m   Pleasant, well-appearing male, in good spirits, in no distress. Skin: Still has some residual rash at R forearm (no longer a complete circle, 2 papular areas with slight linear connection between 3 areas (and healed on the other portion).  No erythema. Still has some raised, circular area within the tattoo--still at the letter B, and also at the R and E Much less red than at prior visit, and decreased in size.  ASSESSMENT/PLAN:  Tinea corporis - responding to lamisil but slowly/incomplete.  Trial of oral lamisil x 2 weeks (just had normal LFT's) - Plan: terbinafine (LAMISIL) 250 MG tablet  Hypercholesterolemia - on work labs.  LDL higher and HDL lower than 08/2017. Reviewed low cholesterol diet in detail; increase exercise. Recheck next year  Impaired fasting glucose - counseled re: diet (cut back on sugary beverages, increase exercise to minimum 150/week cardio)   F/u after work labs done for CPE (has never had CPE, just ones through [employer]; insurance should cover preventative visit here, encouraged to schedule after his labs are back so I can review, as we always do).

## 2019-04-16 NOTE — Patient Instructions (Addendum)
Given that the rash hasn't completely resolved with topical therapy, switch to oral lamisil once daily for 2 weeks.   Cut back some on the red meat and creamy dressings.  Try and eat more chicken. Consider ground [place] for meatballs. Cut back on sweet tea and regular soda to help lower sugar. Increase exercise to help raise the HDL which dropped.  Goal is minimum of 150 minutes of cardio each week.   High Cholesterol  High cholesterol is a condition in which the blood has high levels of a white, waxy, fat-like substance (cholesterol). The human body needs small amounts of cholesterol. The liver makes all the cholesterol that the body needs. Extra (excess) cholesterol comes from the food that we eat. Cholesterol is carried from the liver by the blood through the blood vessels. If you have high cholesterol, deposits (plaques) may build up on the walls of your blood vessels (arteries). Plaques make the arteries narrower and stiffer. Cholesterol plaques increase your risk for heart attack and stroke. Work with your health care provider to keep your cholesterol levels in a healthy range. What increases the risk? This condition is more likely to develop in people who:  Eat foods that are high in animal fat (saturated fat) or cholesterol.  Are overweight.  Are not getting enough exercise.  Have a family history of high cholesterol. What are the signs or symptoms? There are no symptoms of this condition. How is this diagnosed? This condition may be diagnosed from the results of a blood test.  If you are older than age 45, your health care provider may check your cholesterol every 4-6 years.  You may be checked more often if you already have high cholesterol or other risk factors for heart disease. The blood test for cholesterol measures:  "Bad" cholesterol (LDL cholesterol). This is the main type of cholesterol that causes heart disease. The desired level for LDL is less than 100.  "Good"  cholesterol (HDL cholesterol). This type helps to protect against heart disease by cleaning the arteries and carrying the LDL away. The desired level for HDL is 60 or higher.  Triglycerides. These are fats that the body can store or burn for energy. The desired number for triglycerides is lower than 150.  Total cholesterol. This is a measure of the total amount of cholesterol in your blood, including LDL cholesterol, HDL cholesterol, and triglycerides. A healthy number is less than 200. How is this treated? This condition is treated with diet changes, lifestyle changes, and medicines. Diet changes  This may include eating more whole grains, fruits, vegetables, nuts, and fish.  This may also include cutting back on red meat and foods that have a lot of added sugar. Lifestyle changes  Changes may include getting at least 40 minutes of aerobic exercise 3 times a week. Aerobic exercises include walking, biking, and swimming. Aerobic exercise along with a healthy diet can help you maintain a healthy weight.  Changes may also include quitting smoking. Medicines  Medicines are usually given if diet and lifestyle changes have failed to reduce your cholesterol to healthy levels.  Your health care provider may prescribe a statin medicine. Statin medicines have been shown to reduce cholesterol, which can reduce the risk of heart disease. Follow these instructions at home: Eating and drinking If told by your health care provider:  Eat chicken (without skin), fish, veal, shellfish, ground [place] breast, and round or loin cuts of red meat.  Do not eat fried foods or fatty  meats, such as hot dogs and salami.  Eat plenty of fruits, such as apples.  Eat plenty of vegetables, such as broccoli, potatoes, and carrots.  Eat beans, peas, and lentils.  Eat grains such as barley, rice, couscous, and bulgur wheat.  Eat pasta without cream sauces.  Use skim or nonfat milk, and eat low-fat or nonfat  yogurt and cheeses.  Do not eat or drink whole milk, cream, ice cream, egg yolks, or hard cheeses.  Do not eat stick margarine or tub margarines that contain trans fats (also called partially hydrogenated oils).  Do not eat saturated tropical oils, such as coconut oil and palm oil.  Do not eat cakes, cookies, crackers, or other baked goods that contain trans fats.  General instructions  Exercise as directed by your health care provider. Increase your activity level with activities such as gardening, walking, and taking the stairs.  Take over-the-counter and prescription medicines only as told by your health care provider.  Do not use any products that contain nicotine or tobacco, such as cigarettes and e-cigarettes. If you need help quitting, ask your health care provider.  Keep all follow-up visits as told by your health care provider. This is important. Contact a health care provider if:  You are struggling to maintain a healthy diet or weight.  You need help to start on an exercise program.  You need help to stop smoking. Get help right away if:  You have chest pain.  You have trouble breathing. This information is not intended to replace advice given to you by your health care provider. Make sure you discuss any questions you have with your health care provider. Document Revised: 12/21/2016 Document Reviewed: 06/18/2015 Elsevier Patient Education  [place].   Cholesterol Content in Foods Cholesterol is a waxy, fat-like substance that helps to carry fat in the blood. The body needs cholesterol in small amounts, but too much cholesterol can cause damage to the arteries and heart. Most people should eat less than 200 milligrams (mg) of cholesterol a day. Foods with cholesterol  Cholesterol is found in animal-based foods, such as meat, seafood, and dairy. Generally, low-fat dairy and lean meats have less cholesterol than full-fat dairy and fatty meats. The  milligrams of cholesterol per serving (mg per serving) of common cholesterol-containing foods are listed below. Meat and other proteins  Egg -- one large whole egg has 186 mg.  Veal shank -- 4 oz has 141 mg.  Lean ground [place] (93% lean) -- 4 oz has 118 mg.  Fat-trimmed lamb loin -- 4 oz has 106 mg.  Lean ground beef (90% lean) -- 4 oz has 100 mg.  Lobster -- 3.5 oz has 90 mg.  Pork loin chops -- 4 oz has 86 mg.  Canned salmon -- 3.5 oz has 83 mg.  Fat-trimmed beef top loin -- 4 oz has 78 mg.  Frankfurter -- 1 frank (3.5 oz) has 77 mg.  Crab -- 3.5 oz has 71 mg.  Roasted chicken without skin, white meat -- 4 oz has 66 mg.  Light bologna -- 2 oz has 45 mg.  Deli-cut [place] -- 2 oz has 31 mg.  Canned tuna -- 3.5 oz has 31 mg.  Berniece Salines -- 1 oz has 29 mg.  Oysters and mussels (raw) -- 3.5 oz has 25 mg.  Mackerel -- 1 oz has 22 mg.  Trout -- 1 oz has 20 mg.  Pork sausage -- 1 link (1 oz) has 17 mg.  Salmon --  1 oz has 16 mg.  Tilapia -- 1 oz has 14 mg. Dairy  Soft-serve ice cream --  cup (4 oz) has 103 mg.  Whole-milk yogurt -- 1 cup (8 oz) has 29 mg.  Cheddar cheese -- 1 oz has 28 mg.  American cheese -- 1 oz has 28 mg.  Whole milk -- 1 cup (8 oz) has 23 mg.  2% milk -- 1 cup (8 oz) has 18 mg.  Cream cheese -- 1 tablespoon (Tbsp) has 15 mg.  Cottage cheese --  cup (4 oz) has 14 mg.  Low-fat (1%) milk -- 1 cup (8 oz) has 10 mg.  Sour cream -- 1 Tbsp has 8.5 mg.  Low-fat yogurt -- 1 cup (8 oz) has 8 mg.  Nonfat Greek yogurt -- 1 cup (8 oz) has 7 mg.  Half-and-half cream -- 1 Tbsp has 5 mg. Fats and oils  Cod liver oil -- 1 tablespoon (Tbsp) has 82 mg.  Butter -- 1 Tbsp has 15 mg.  Lard -- 1 Tbsp has 14 mg.  Bacon grease -- 1 Tbsp has 14 mg.  Mayonnaise -- 1 Tbsp has 5-10 mg.  Margarine -- 1 Tbsp has 3-10 mg. Exact amounts of cholesterol in these foods may vary depending on specific ingredients and brands. Foods without  cholesterol Most plant-based foods do not have cholesterol unless you combine them with a food that has cholesterol. Foods without cholesterol include:  Grains and cereals.  Vegetables.  Fruits.  Vegetable oils, such as olive, canola, and sunflower oil.  Legumes, such as peas, beans, and lentils.  Nuts and seeds.  Egg whites. Summary  The body needs cholesterol in small amounts, but too much cholesterol can cause damage to the arteries and heart.  Most people should eat less than 200 milligrams (mg) of cholesterol a day. This information is not intended to replace advice given to you by your health care provider. Make sure you discuss any questions you have with your health care provider. Document Revised: 11/30/2016 Document Reviewed: 08/14/2016 Elsevier Patient Education  [place].

## 2019-04-21 ENCOUNTER — Encounter: Payer: Self-pay | Admitting: Family Medicine

## 2019-08-05 ENCOUNTER — Other Ambulatory Visit: Payer: Self-pay

## 2019-08-05 ENCOUNTER — Encounter: Payer: Self-pay | Admitting: Medical

## 2019-08-05 ENCOUNTER — Ambulatory Visit: Payer: Self-pay | Admitting: Medical

## 2019-08-05 VITALS — BP 126/78 | HR 47 | Ht 71.0 in | Wt 215.8 lb

## 2019-08-05 DIAGNOSIS — R001 Bradycardia, unspecified: Secondary | ICD-10-CM | POA: Insufficient documentation

## 2019-08-05 DIAGNOSIS — Z0289 Encounter for other administrative examinations: Secondary | ICD-10-CM

## 2019-08-05 LAB — POCT URINALYSIS DIP (PROADVANTAGE DEVICE)
Bilirubin, UA: NEGATIVE
Blood, UA: NEGATIVE
Glucose, UA: NEGATIVE mg/dL
Ketones, POC UA: NEGATIVE mg/dL
Leukocytes, UA: NEGATIVE
Nitrite, UA: NEGATIVE
Protein Ur, POC: NEGATIVE mg/dL
Specific Gravity, Urine: 1.025
Urobilinogen, Ur: NEGATIVE
pH, UA: 6 (ref 5.0–8.0)
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
## 2019-08-06 ENCOUNTER — Telehealth: Payer: Self-pay | Admitting: Medical

## 2019-08-06 ENCOUNTER — Encounter: Payer: Self-pay | Admitting: Family Medicine

## 2019-08-06 NOTE — Telephone Encounter (Signed)
Please call and let her know that I appreciate him bringing in the EKG.  They look good.

## 2019-08-06 NOTE — Telephone Encounter (Signed)
Pt was notified.  

## 2019-08-26 ENCOUNTER — Encounter: Payer: 59 | Admitting: Medical

## 2019-12-21 ENCOUNTER — Ambulatory Visit
Admission: EM | Admit: 2019-12-21 | Discharge: 2019-12-21 | Disposition: A | Discharge status: Home / Self Care | Payer: 59 | Attending: Emergency Medicine | Admitting: Emergency Medicine

## 2019-12-21 DIAGNOSIS — R21 Rash and other nonspecific skin eruption: Secondary | ICD-10-CM

## 2019-12-21 DIAGNOSIS — L739 Follicular disorder, unspecified: Secondary | ICD-10-CM

## 2019-12-21 MED ORDER — TRIAMCINOLONE ACETONIDE 0.1 % EX CREA: 1.0000 "application " | TOPICAL_CREAM | Freq: Two times a day (BID) | CUTANEOUS | 0 refills | Status: DC

## 2019-12-21 MED ORDER — DOXYCYCLINE HYCLATE 100 MG PO CAPS: 100.0000 mg | ORAL_CAPSULE | Freq: Two times a day (BID) | ORAL | 0 refills | Status: AC

## 2019-12-21 NOTE — Discharge Instructions (Signed)
Begin doxycycline twice daily  Triamcinolone twice daily to help with itching Follow up if not improving or worsening
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
## 2019-12-21 NOTE — ED Triage Notes (Signed)
Pt c/o itching rash to left side of face in beard area for approx 1.5 weeks. Pt states he shaves every 3rd day with disposable razor.  Denies drainage from area, fever.

## 2019-12-24 ENCOUNTER — Ambulatory Visit: Payer: 59 | Admitting: Family Medicine

## 2020-04-25 ENCOUNTER — Telehealth: Payer: Self-pay | Admitting: Family Medicine

## 2020-04-25 ENCOUNTER — Encounter: Payer: Self-pay | Admitting: Family Medicine

## 2020-04-25 ENCOUNTER — Telehealth: Payer: 59 | Admitting: Family Medicine

## 2020-04-25 ENCOUNTER — Other Ambulatory Visit: Payer: Self-pay

## 2020-04-25 VITALS — Temp 96.9°F | Ht 71.0 in | Wt 215.0 lb

## 2020-04-25 DIAGNOSIS — R112 Nausea with vomiting, unspecified: Secondary | ICD-10-CM | POA: Diagnosis not present

## 2020-04-25 MED ORDER — ONDANSETRON 4 MG PO TBDP: 4.0000 mg | ORAL_TABLET | Freq: Three times a day (TID) | ORAL | 0 refills | Status: DC | PRN

## 2020-04-25 NOTE — Progress Notes (Addendum)
Start time: 9:45 End time: 10:02  Virtual Visit via Video Note  I connected with Paul Taylor on 04/25/20 by a video enabled telemedicine application and verified that I am speaking with the correct person using two identifiers.  Location: Patient: home Provider: office   I discussed the limitations of evaluation and management by telemedicine and the availability of in person appointments. The patient expressed understanding and agreed to proceed.  History of Present Illness:  Chief Complaint  Patient presents with  . Diarrhea    VIRTUAL vomiting and diarrhea. Started Friday with diarrhea. Saturday morning vomited. Sat am has a regular stool, but diarrhea Sat night. Vomited again Sun am, but did eat dinner last night and was ok. No fever.    He had a normal BM Friday morning.  That evening, stool was watery/diarrhea.  Early Saturday morning he vomited once.  He ate fine on Saturday, and after dinner he had another episode of diarrhea.  Sunday morning after eggs and grits he vomited again.  The rest of the day he felt okay, stomach felt like "something was there", not really pain.  Fine after dinner last night (grilled chicken and vegetables). Feels okay today.  Normal BM shortly prior to visit.  No sick contacts, no recent antibiotics, camping or travel. Had some coworkers that were sick a couple of weeks ago.  PMH, PSH, SH reviewed  Outpatient Encounter Medications as of 04/25/2020  Medication Sig Note  . tadalafil (CIALIS) 5 MG tablet Take 5 mg by mouth daily as needed. (Patient not taking: Reported on 04/25/2020) 02/20/2018: Uses prn  . triamcinolone (KENALOG) 0.1 % Apply 1 application topically 2 (two) times daily. (Patient not taking: Reported on 04/25/2020)    No facility-administered encounter medications on file as of 04/25/2020.   Allergies  Allergen Reactions  . Penicillins Other (See Comments)    Historical, told by parent.   ROS:  No fever, chills, URI symptoms,  headaches. GI complaints per HPI.  No urinary complaints or other concerns.    Observations/Objective:  Temp (!) 96.9 F (36.1 C) (Oral)   Ht 5\' 11"  (1.803 m)   Wt 215 lb (97.5 kg)   BMI 29.99 kg/m   Well-appearing, pleasant male, in good spirits, in no distress He is alert, oriented, normal speech, grooming, mood/affect Exam is limited due to virtual nature of the visit  Assessment and Plan:  Non-intractable vomiting with nausea, unspecified vomiting type - 2 episdes of each over 2-3 days. feeling well today. Ddx reviewed. Rec home COVID test. Proper diet reviewed, f/u if persists/worsens   Will drop off labs from [facility]--and if abnormal, will r/s visit to discuss.   Follow Up Instructions:    I discussed the assessment and treatment plan with the patient. The patient was provided an opportunity to ask questions and all were answered. The patient agreed with the plan and demonstrated an understanding of the instructions.   The patient was advised to call back or seek an in-person evaluation if the symptoms worsen or if the condition fails to improve as anticipated.  I spent 23 minutes dedicated to the care of this patient, including pre-visit review of records, face to face time, post-visit ordering of testing and documentation.  ADDENDUM: Additional 10 min phone conversation at [facility], after he called after another bout of emesis.   D/w pt.  Only vomited once today (3x total). Has some discomfort in upper stomach. Had baja chicken wrap and a smoothie for lunch. (discussed the  cheese/spices in wrap, and poss citrus vs dairy in the juice). Reinforced bland diet. Recommended trying pepcid or prilosec (given pain in upper stomach), and reviewed how/when to use the zofran prn.  Total time for visit now 84 min  Vikki Ports, MD

## 2020-04-25 NOTE — Telephone Encounter (Signed)
D/w pt.  Only vomited once today (3x total). Has some discomfort in upper stomach. Had baja chicken wrap and a smoothie for lunch. (discussed the cheese/spices in wrap, and poss citrus vs dairy in the juice). Reinforced bland diet. Recommended trying pepcid or prilosec (given pain in upper stomach), and reviewed how/when to use the zofran prn.

## 2020-04-25 NOTE — Patient Instructions (Signed)
Stay well hydrated. Follow a bland diet (lay off the spicy foods until stomach settles down). Avoid dairy for the next 4-5 days.  I suspect this is viral, and self-limited.  We discussed doing a home COVID test, as we have seen some GI symptoms with COVID.   Diarrhea, Adult Diarrhea is frequent loose and watery bowel movements. Diarrhea can make you feel weak and cause you to become dehydrated. Dehydration can make you tired and thirsty, cause you to have a dry mouth, and decrease how often you urinate. Diarrhea typically lasts 2-3 days. However, it can last longer if it is a sign of something more serious. It is important to treat your diarrhea as told by your health care provider. Follow these instructions at home: Eating and drinking Follow these recommendations as told by your health care provider:  Take an oral rehydration solution (ORS). This is an over-the-counter medicine that helps return your body to its normal balance of nutrients and water. It is found at pharmacies and retail stores.  Drink plenty of fluids, such as water, ice chips, diluted fruit juice, and low-calorie sports drinks. You can drink milk also, if desired.  Avoid drinking fluids that contain a lot of sugar or caffeine, such as energy drinks, sports drinks, and soda.  Eat bland, easy-to-digest foods in small amounts as you are able. These foods include bananas, applesauce, rice, lean meats, toast, and crackers.  Avoid alcohol.  Avoid spicy or fatty foods.      Medicines  Take over-the-counter and prescription medicines only as told by your health care provider.  If you were prescribed an antibiotic medicine, take it as told by your health care provider. Do not stop using the antibiotic even if you start to feel better. General instructions  Wash your hands often using soap and water. If soap and water are not available, use a hand sanitizer. Others in the household should wash their hands as well. Hands  should be washed: ? After using the toilet or changing a diaper. ? Before preparing, cooking, or serving food. ? While caring for a sick person or while visiting someone in a hospital.  Drink enough fluid to keep your urine pale yellow.  Rest at home while you recover.  Watch your condition for any changes.  Take a warm bath to relieve any burning or pain from frequent diarrhea episodes.  Keep all follow-up visits as told by your health care provider. This is important.   Contact a health care provider if:  You have a fever.  Your diarrhea gets worse.  You have new symptoms.  You cannot keep fluids down.  You feel light-headed or dizzy.  You have a headache.  You have muscle cramps. Get help right away if:  You have chest pain.  You feel extremely weak or you faint.  You have bloody or black stools or stools that look like tar.  You have severe pain, cramping, or bloating in your abdomen.  You have trouble breathing or you are breathing very quickly.  Your heart is beating very quickly.  Your skin feels cold and clammy.  You feel confused.  You have signs of dehydration, such as: ? Dark urine, very little urine, or no urine. ? Cracked lips. ? Dry mouth. ? Sunken eyes. ? Sleepiness. ? Weakness. Summary  Diarrhea is frequent loose and watery bowel movements. Diarrhea can make you feel weak and cause you to become dehydrated.  Drink enough fluids to keep your urine pale  yellow.  Make sure that you wash your hands after using the toilet. If soap and water are not available, use hand sanitizer.  Contact a health care provider if your diarrhea gets worse or you have new symptoms.  Get help right away if you have signs of dehydration. This information is not intended to replace advice given to you by your health care provider. Make sure you discuss any questions you have with your health care provider. Document Revised: 05/06/2018 Document Reviewed:  05/24/2017 Elsevier Patient Education  2021 [employer].

## 2020-04-25 NOTE — Addendum Note (Signed)
Addended by: Rita Ohara on: 04/25/2020 05:12 PM   Modules accepted: Level of Service

## 2020-04-25 NOTE — Telephone Encounter (Signed)
Pt called back and he has been vomitting since lunch.  Pt wants you to call in rx to [place].

## 2020-04-25 NOTE — Telephone Encounter (Signed)
Pt called and states that after a light lunch, he has a tightness in his stomach and again had a upset stomach. Pt uses [facility] and can be reached at  [phone]

## 2020-04-26 NOTE — Progress Notes (Signed)
done

## 2020-04-28 ENCOUNTER — Ambulatory Visit: Payer: 59 | Admitting: Family Medicine

## 2020-04-28 ENCOUNTER — Telehealth: Payer: Self-pay | Admitting: Family Medicine

## 2020-04-28 NOTE — Telephone Encounter (Signed)
Pt came and dropped off labs.

## 2020-05-02 NOTE — Telephone Encounter (Signed)
Lipids are higher than 2019, a little lower than last year (borderline high).  He can schedule office visit if he wants to discuss cholesterol and lab results in more detail. (he cancelled the visit he had scheduled when he got sick).  He should continue to work on diet to improve his cholesterol

## 2020-05-04 NOTE — Telephone Encounter (Signed)
Patient advised.

## 2020-05-10 ENCOUNTER — Encounter: Payer: Self-pay | Admitting: Family Medicine

## 2021-03-30 ENCOUNTER — Ambulatory Visit: Payer: 59 | Admitting: Family Medicine

## 2021-03-30 ENCOUNTER — Encounter: Payer: Self-pay | Admitting: Family Medicine

## 2021-03-30 VITALS — BP 110/60 | HR 56 | Ht 72.0 in | Wt 213.2 lb

## 2021-03-30 DIAGNOSIS — E7800 Pure hypercholesterolemia, unspecified: Secondary | ICD-10-CM

## 2021-03-30 DIAGNOSIS — Z1211 Encounter for screening for malignant neoplasm of colon: Secondary | ICD-10-CM

## 2021-03-30 DIAGNOSIS — R9431 Abnormal electrocardiogram [ECG] [EKG]: Secondary | ICD-10-CM | POA: Diagnosis not present

## 2021-03-30 DIAGNOSIS — R7301 Impaired fasting glucose: Secondary | ICD-10-CM

## 2021-03-30 LAB — POCT GLYCOSYLATED HEMOGLOBIN (HGB A1C): Hemoglobin A1C: 5.6 % (ref 4.0–5.6)

## 2021-03-30 NOTE — Patient Instructions (Addendum)
Try and increase your cardio to at least 150 minutes/week. ?This might help your HDL (and definitely helps prevent cardiovascular disease). ? ?Cut back on red meat, cheese, etc as we discussed (see handout on cholesterol). ? ?Return for a fasting lab visit in 3-4 months for [place] to recheck your cholesterol and see if dietary changes have helped.  ? ?Your sugar is pre-diabetic range.  The good news is that the hemoglobin A1c was still normal (3 month average look at sugars). ?To keep sugars down (and prevent progression to diabetes) we recommend getting daily exercise, limiting sugar (in beverages such as tea, soda, juice).  ? ?Cut out regular soda and sweet tea. I prefer that you drink more water. If you need caffeine, you can do a diet soda, or tea/coffee with artificial sweetener (or just a small of amount sugar in it). ? ?Try and have fruit or salads as a side rather than chips or fries. ? ?Please forward your cancer screening results so that I can review them. I specifically would like to see the echocardiogram (heart ultrasound). ? ?Use Lotrimin or Lamisil antifungal cream (over-the-counter) twice daily for up to 3 weeks.  Make sure that it is entirely better before stopping use. ?Return for re-evaluation if the rash is not improving at all, or if spreading/worsening. ? ?

## 2021-03-30 NOTE — Progress Notes (Signed)
Chief Complaint  ?Patient presents with  ? Consult  ?  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 He would like to follow up on labs and EKG. Also has a rash on right arm x 1 month that is itchy and he believes to be ring worm. Covid booster offered, unsure if he wants.  ? ?Patient presents to discuss abnormal labs and EKG done by his work physical.  ? ?He is also complaining of rash on R forearm. ?He has h/o ringworm on LUE (within his tattoo).  He has noticed recurrent small spot in this area about a week ago. He has a larger lesion on the R forearm that has been there for almost a month.  He hasn't noticed any significant change in size since first ware of it.  It is itchy when he takes a hot shower. ? ?Labs from 02/15/21 were reviewed and explained to patient in detail: ?Fasting glucose 113, Rest of chem panel was normal. (T bili normal, prev elevated) ?TC 225, HDL 44, TG 117, LDL 158, chol/HDL ratio 5.1 ?CBC notable for RBC 5.96, MCV 72.3.  Hg 13.5, WBC 6.1, plt313 ?PSA 0.48 ? ?EKG brought in was read as "sinus bradycardia, possible acute pericarditis, abnormal ECG" ?He didn't have any chest pain or concerns at that time, but may have had COVID around Christmas (wife had COVID, he had symptoms for a day or two with fatigue, didn't get tested). ? ?My interpretation--sinus bradycardia (rate 57).  There was some ST elevation of V3-V6 noted, no other abnormalities. ? 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 Has appt for 4/17 ? ?Current diet:  eats red meat 2x/week (burger, cheese, steak, bacon (2 slices once/week)), 2-3 eggs/week. +butter with grits, no other butter. ?Occasional ice cream, no regular dairy products. ?Cheese on sandwiches, mayo on sandwiches. ?Sandwiches 2x/week, ham or [place]. ?Sweet tea with lunch, regular ginger ale with dinner, and OJ in the morning. ?Small portions of rice and  pasta. ? ?Exercises 2-3x/week. ? ?PMH, PSH, SH and FH were reviewed and updated today ? ?Outpatient Encounter Medications as of 03/30/2021  ?Medication Sig Note  ? tadalafil (CIALIS) 5 MG tablet Take 5 mg by mouth daily as needed. (Patient not taking: Reported on 04/25/2020) 02/20/2018: Uses prn  ? triamcinolone (KENALOG) 0.1 % Apply 1 application topically 2 (two) times daily. (Patient not taking: Reported on 04/25/2020)   ? [DISCONTINUED] ondansetron (ZOFRAN ODT) 4 MG disintegrating tablet Take 1 tablet (4 mg total) by mouth every 8 (eight) hours as needed for nausea or vomiting.   ? ?No facility-administered encounter medications on file as of 03/30/2021.  ? ?Allergies  ?Allergen Reactions  ? Penicillins Other (See Comments)  ?  Historical, told by parent.  ? ?ROS: Denies fever, chills, URI symptoms, headaches, dizziness, shortness of breath, chest pain.  Denies nausea, vomiting, bowel changes, urinary complaints, bleeding, bruising. No joint pains.  Rash on UE's per HPI. Moods are good.Marland Kitchen ?See HPI ? ? ?PHYSICAL EXAM: ? ?BP 110/60   Pulse (!) 56   Ht 6' (1.829 m)   Wt 213 lb 3.2 oz (96.7 kg)   BMI 28.92 kg/m?  ? ?Well-appearing, pleasant male, in no distress ?HEENT: conjunctiva and sclera are clear, EOMI ?Neck: no lymphadenopathy, thyromegaly or mass ?Heart: regular rate and rhythm ?Lungs: clear bilaterally ?Chest: nontender ?Abdomen: soft, nontender, no organomegaly or mass ?Extremities: no edema ?Skin: 3 x  1.8 cm area of erythema at R upper forearm. The edges are raised, pink, flaky. ?Central area isn't completely clear--not pink but still slightly rough in the center ?On the LUE, above the "e" in [place] tattoo there is a small circular area with raised edges, central clearing. ?Neuro: alert and oriented, normal strength, gait. CN grossly intact ?Psych: normal mood, affect, hygiene and grooming ? ?Lab Results  ?Component Value Date  ? HGBA1C 5.6 03/30/2021  ? ? ?ASSESSMENT/PLAN: ? ?Hypercholesterolemia - Low  cholesterol diet reviewed.  Recheck in 3 mos - Plan: Lipid panel ? ?Impaired fasting glucose - normal A1c, elevated fglu. Reviewed proper diet, exercise, weight - Plan: HgB A1c, Glucose, random ? ?Colon cancer screening - past due, refer for colonoscopy (wants to do in October when he has vacation) - Plan: Ambulatory referral to Gastroenterology ? ?EKG abnormality - unclear why he would have ST elevation changes to suggest pericarditis. Asymptomatic. Planning on getting echo next month ? ?F/u fasting labs 3-4 months. ?Pt will get [place] copies of results from cancer screening testing including echocardiogram (scheduled for next month). ? ?I spent 53 minutes dedicated to the care of this patient, including pre-visit review of records, face to face time, post-visit ordering of testing and documentation. ? ?

## 2021-05-17 ENCOUNTER — Encounter: Payer: Self-pay | Admitting: Family Medicine

## 2021-05-17 ENCOUNTER — Ambulatory Visit: Payer: 59 | Admitting: Family Medicine

## 2021-05-17 VITALS — BP 110/70 | HR 60 | Ht 72.0 in | Wt 211.8 lb

## 2021-05-17 DIAGNOSIS — E041 Nontoxic single thyroid nodule: Secondary | ICD-10-CM

## 2021-05-17 DIAGNOSIS — R7301 Impaired fasting glucose: Secondary | ICD-10-CM

## 2021-05-17 DIAGNOSIS — E7800 Pure hypercholesterolemia, unspecified: Secondary | ICD-10-CM | POA: Diagnosis not present

## 2021-05-17 NOTE — Patient Instructions (Signed)
? ?  Feel free to call [facility] directly to discuss getting set up for your colonoscopy when you have the time off in October.  It looks like the referral was authorized, they just haven't contacted you.   ?

## 2021-05-17 NOTE — Progress Notes (Signed)
Chief Complaint  ?Patient presents with  ? Consult  ?  Had some diagnostic testing through [facility] [employer]-one .9cm thyroid nodule was found and L sided kidney stone, not having any L sided pain. Polyp in gallbladder. Wanted to follow up.   ? 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 ?Echo: normal, EF 60-65% ?Carotid UD normal ?Thyroid US--0.9cm solid nodule in the isthmus portion of thyroid gland ?Abdominal US: no AAA, normal liver, poss polyp in fundus of gallbladder.  A few echogenic structures in the L kidney ?Normal scrotal/pelvic US, and normal bladder US ? ?He hasn't heard anything regarding colonoscopy referral yet (not planning to get until October, referral placed at last visit). ? ?He is scheduled to return for repeat fasting labs in July.  ?He reports he is drinking less sweet tea (not daily). ?Trying to drink more water and cut back on ginger ale (still having most days with dinner). ?Only small cup of OJ daily. ? ? ?PMH, PSH, SH reviewed ? ?Outpatient Encounter Medications as of 05/17/2021  ?Medication Sig Note  ? tadalafil (CIALIS) 5 MG tablet Take 5 mg by mouth daily as needed. (Patient not taking: Reported on 04/25/2020) 02/20/2018: Uses prn  ? triamcinolone (KENALOG) 0.1 % Apply 1 application topically 2 (two) times daily. (Patient not taking: Reported on 04/25/2020)   ? ?No facility-administered encounter medications on file as of 05/17/2021.  ? ?Allergies  ?Allergen Reactions  ? Penicillins Other (See Comments)  ?  Historical, told by parent.  ? ?ROS: He denies any headaches, dizziness or neurologic complaints (asking about whether he needs any screening brain imaging due to his father's history of brain cancer). ?He denies any changes to hair/skin/bowels/energy/weight. Notes skin is always a little greasy.  Denies rashes, fever, URI symptoms, chest pain, palpitations or other concerns.  See HPI. ? ? ?PHYSICAL EXAM: ? ?BP 110/70   Pulse 60   Ht 6' (1.829 m)    Wt 211 lb 12.8 oz (96.1 kg)   BMI 28.73 kg/m?  ? ?Wt Readings from Last 3 Encounters:  ?05/17/21 211 lb 12.8 oz (96.1 kg)  ?03/30/21 213 lb 3.2 oz (96.7 kg)  ?04/25/20 215 lb (97.5 kg)  ? ? ?Well developed, pleasant male in good spirits, in no distress ?HEENT: conjunctiva and sclera are clear, EOMI ?Neck: no lymphadenopathy.  There is approx 1cm or less soft mass just to the right of midline (overlying the Adam's apple area, but feels soft, not cartilagenous). ?Heart: regular rate and rhythm ?Lungs: clear bilaterally ?Abdomen: soft, nontender, no mass ?Psych: normal mood, affect, hygiene and grooming ?Neuro: alert and oriented, grossly normal cranial nerves, normal strength, gait ? ? ?ASSESSMENT/PLAN: ? ?Thyroid nodule - will schedule diagnostic US, which should give recommendations regarding f/u vs biopsy. TSH not checked through [facility], will check today (euthryoid by hx) - Plan: US THYROID, TSH ? ?Impaired fasting glucose - he has cut back some on sugary beverages, encouraged to cut back further ? ?Hypercholesterolemia - cont low cholesterol diet and return as planned for repeat labs in July ? ? ?

## 2021-05-18 LAB — TSH: TSH: 0.918 u[IU]/mL (ref 0.450–4.500)

## 2021-06-02 ENCOUNTER — Other Ambulatory Visit: Payer: 59

## 2021-06-02 ENCOUNTER — Inpatient Hospital Stay: Admission: RE | Admit: 2021-06-02 | Payer: 59 | Source: Ambulatory Visit

## 2021-06-02 ENCOUNTER — Ambulatory Visit
Admission: RE | Admit: 2021-06-02 | Discharge: 2021-06-02 | Disposition: A | Discharge status: Home / Self Care | Payer: 59 | Source: Ambulatory Visit | Attending: Family Medicine | Admitting: Family Medicine

## 2021-06-02 DIAGNOSIS — E041 Nontoxic single thyroid nodule: Secondary | ICD-10-CM

## 2021-07-12 ENCOUNTER — Other Ambulatory Visit: Payer: 59

## 2021-07-13 ENCOUNTER — Other Ambulatory Visit: Payer: 59

## 2021-07-13 DIAGNOSIS — R7301 Impaired fasting glucose: Secondary | ICD-10-CM

## 2021-07-13 DIAGNOSIS — E7800 Pure hypercholesterolemia, unspecified: Secondary | ICD-10-CM

## 2021-07-14 LAB — LIPID PANEL
Chol/HDL Ratio: 4.3 ratio (ref 0.0–5.0)
Cholesterol, Total: 186 mg/dL (ref 100–199)
HDL: 43 mg/dL (ref >39)
LDL Chol Calc (NIH): 129 mg/dL — ABNORMAL HIGH (ref 0–99)
Triglycerides: 75 mg/dL (ref 0–149)
VLDL Cholesterol Cal: 14 mg/dL (ref 5–40)

## 2021-07-14 LAB — GLUCOSE, RANDOM: Glucose: 96 mg/dL (ref 70–99)

## 2021-07-20 ENCOUNTER — Encounter: Payer: Self-pay | Admitting: Family Medicine

## 2021-08-04 ENCOUNTER — Encounter: Payer: Self-pay | Admitting: Gastroenterology

## 2021-09-14 ENCOUNTER — Ambulatory Visit (AMBULATORY_SURGERY_CENTER): Payer: 59

## 2021-09-14 ENCOUNTER — Other Ambulatory Visit: Payer: Self-pay

## 2021-09-14 VITALS — Ht 72.0 in | Wt 204.6 lb

## 2021-09-14 DIAGNOSIS — Z1211 Encounter for screening for malignant neoplasm of colon: Secondary | ICD-10-CM

## 2021-09-14 MED ORDER — NA SULFATE-K SULFATE-MG SULF 17.5-3.13-1.6 GM/177ML PO SOLN: 1.0000 | Freq: Once | ORAL | 0 refills | Status: AC

## 2021-09-14 NOTE — Progress Notes (Signed)
Denies allergies to eggs or soy products. Denies complication of anesthesia or sedation. Denies use of weight loss medication. Denies use of O2.   Emmi instructions given for colonoscopy.  

## 2021-10-10 ENCOUNTER — Encounter: Payer: Self-pay | Admitting: Gastroenterology

## 2021-10-13 ENCOUNTER — Telehealth: Payer: Self-pay | Admitting: Gastroenterology

## 2021-10-13 NOTE — Telephone Encounter (Signed)
Reached voicemail.  Left a message recommending he wait and see how he feels on Monday (3 days)  If he is not better, call and cancel and make appointment with primary care.

## 2021-10-13 NOTE — Telephone Encounter (Signed)
Inbound call from patient he is schedule for a Colonoscopy 10/19/21 , patient states he have a cold and sore throat for the past 3 days and wanted to know if he should reschedule procedure.Please advise

## 2021-10-19 ENCOUNTER — Encounter: Payer: 59 | Admitting: Gastroenterology

## 2021-10-19 ENCOUNTER — Encounter: Payer: Self-pay | Admitting: Family Medicine

## 2021-10-19 ENCOUNTER — Ambulatory Visit: Payer: 59 | Admitting: Family Medicine

## 2021-10-19 VITALS — BP 118/68 | HR 56 | Temp 98.0°F | Ht 72.0 in | Wt 209.8 lb

## 2021-10-19 DIAGNOSIS — J069 Acute upper respiratory infection, unspecified: Secondary | ICD-10-CM | POA: Diagnosis not present

## 2021-10-19 DIAGNOSIS — R051 Acute cough: Secondary | ICD-10-CM

## 2021-10-19 MED ORDER — BENZONATATE 200 MG PO CAPS: 200.0000 mg | ORAL_CAPSULE | Freq: Three times a day (TID) | ORAL | 0 refills | Status: DC | PRN

## 2021-10-19 NOTE — Patient Instructions (Signed)
Drink plenty of water. Use the benzonatate up to three times daily as needed for cough. I suspect the cough is related to postnasal drainage, and stopping the Dayquil stopped the decongestant which will be beneficial to have back on board.  You choices are to restart the Dayquil severe and use that as directed,  OR Get a 12 hour Mucinex DM and use a separate sudafed as needed for nasal congestion/drainage. (Phenylephrine or pseudoephedrine).  (These have the same guaifenesin, dextromethorphan and decongestant; the dayquil also has tylenol in it).  Contact us if you develop fever, sinus pain, discolored mucus/phlegm with persistent or worsening cough.  Seek re-evaluation if shortness of breath, pain with breathing, persistent symptoms.

## 2021-10-19 NOTE — Progress Notes (Signed)
Chief Complaint  Patient presents with   Cough    Started with ST last Monday and stuff nose. Cough started around last Wed. At night his cough is very bad. Was using Dayquil but stopped. Using Robitussin DM for cough, not really helping. Never had fever, chills or body aches.     10/9 started with sore throat, then nasal congestion.  Cough started 10/11.  He feels pretty good, but the cough persists, and is worse at night.  He used Dayquil for a couple of days--helped with the nasal congestion and sore throat. Didn't help with the cough, switched to Robitussin DM,  three times  He is not as bad during the day, but the cough is worse at night.  Cough doesn't interfere with his sleep, is worse before going to bed.  Not getting much nasal drainage now, denies sinus pain. Has some PND. Cough is rarely productive, just a small amount first thing in the morning.  Mostly a dry cough. Denies shortness of breath.  Denies sick contacts. Denies any history of allergies--no sneezing, itchy eyes   PMH, PSH, SH reviewed  Outpatient Encounter Medications as of 10/19/2021  Medication Sig Note   guaiFENesin-dextromethorphan (ROBITUSSIN DM) 100-10 MG/5ML syrup Take 5 mLs by mouth every 4 (four) hours as needed for cough. 10/19/2021: Last dose 6:30am   tadalafil (CIALIS) 5 MG tablet Take 5 mg by mouth daily as needed. (Patient not taking: Reported on 10/19/2021) 02/20/2018: Uses prn   No facility-administered encounter medications on file as of 10/19/2021.   Allergies  Allergen Reactions   Penicillins Other (See Comments)    Historical, told by parent.    ROS:  URI symptoms per HPI.  No f/c, n/v/d. No chest pain, shortness of breath.  No rashes, bleeding, bruising.   PHYSICAL EXAM:  BP 118/68   Pulse (!) 56   Temp 98 F (36.7 C) (Tympanic)   Ht 6' (1.829 m)   Wt 209 lb 12.8 oz (95.2 kg)   BMI 28.45 kg/m   Some throat-clearing during visit.  Doesn't sound congested.  No coughing during  visit.  Nasal mucosa is mildly edematous with clear mucus. Some thicker yellow crusting noted anteriorly.  OP is clear. Sinuses are nontender. TM's and EAC's normal. Neck: no lymphadenopathy or mass Heart: regular rate and rhythm Lungs: clear bilaterally, good air movement. No wheezes, rales, ronchi   ASSESSMENT/PLAN:  Viral upper respiratory illness  Acute cough - Plan: benzonatate (TESSALON) 200 MG capsule  Drink plenty of water. Use the benzonatate up to three times daily as needed for cough. I suspect the cough is related to postnasal drainage, and stopping the Dayquil stopped the decongestant which will be beneficial to have back on board.  You choices are to restart the Dayquil severe and use that as directed,  OR Get a 12 hour Mucinex DM and use a separate sudafed as needed for nasal congestion/drainage. (Phenylephrine or pseudoephedrine).  (These have the same guaifenesin, dextromethorphan and decongestant; the dayquil also has tylenol in it).  Contact us if you develop fever, sinus pain, discolored mucus/phlegm with persistent or worsening cough.  Seek re-evaluation if shortness of breath, pain with breathing, persistent symptoms.

## 2021-11-08 ENCOUNTER — Encounter: Payer: Self-pay | Admitting: Gastroenterology

## 2021-11-08 ENCOUNTER — Ambulatory Visit (AMBULATORY_SURGERY_CENTER): Payer: 59 | Admitting: *Deleted

## 2021-11-08 VITALS — Ht 72.0 in | Wt 208.0 lb

## 2021-11-08 DIAGNOSIS — Z1211 Encounter for screening for malignant neoplasm of colon: Secondary | ICD-10-CM

## 2021-11-08 NOTE — Progress Notes (Signed)
Pt's previsit is done over the phone and all paperwork (prep instructions, blank consent form to just read over) sent to patient.  Pt's name and DOB verified at the beginning of the previsit.  Pt denies any difficulty with ambulating.    No egg or soy allergy known to patient  No issues known to pt with past sedation with any surgeries or procedures Patient denies ever being told they had issues or difficulty with intubation  No FH of Malignant Hyperthermia Pt is not on diet pills Pt is not on  home 02  Pt is not on blood thinners  Pt denies issues with constipation  Pt has Suprep at home already  Patient's chart reviewed by Osvaldo Angst CNRA prior to previsit and patient appropriate for the [place].  Previsit completed and red dot placed by patient's name on their procedure day (on provider's schedule).

## 2021-11-08 NOTE — Addendum Note (Signed)
Addended by: Laverna Peace on: 11/08/2021 08:29 AM   Modules accepted: Level of Service

## 2021-11-14 ENCOUNTER — Encounter: Payer: Self-pay | Admitting: Gastroenterology

## 2021-11-14 ENCOUNTER — Ambulatory Visit (AMBULATORY_SURGERY_CENTER): Payer: 59 | Admitting: Gastroenterology

## 2021-11-14 VITALS — BP 112/76 | HR 52 | Temp 97.1°F | Resp 14 | Ht 72.0 in | Wt 208.0 lb

## 2021-11-14 DIAGNOSIS — Z1211 Encounter for screening for malignant neoplasm of colon: Secondary | ICD-10-CM

## 2021-11-14 DIAGNOSIS — D125 Benign neoplasm of sigmoid colon: Secondary | ICD-10-CM | POA: Diagnosis not present

## 2021-11-14 DIAGNOSIS — D122 Benign neoplasm of ascending colon: Secondary | ICD-10-CM

## 2021-11-14 DIAGNOSIS — D123 Benign neoplasm of transverse colon: Secondary | ICD-10-CM

## 2021-11-14 MED ORDER — SODIUM CHLORIDE 0.9 % IV SOLN: 500.0000 mL | Freq: Once | INTRAVENOUS | Status: DC

## 2021-11-14 NOTE — Progress Notes (Signed)
Sedate, gd SR, tolerated procedure well, VSS, report to RN 

## 2021-11-14 NOTE — Patient Instructions (Signed)
Please read handouts provided. Continue present medications. Await pathology results.   YOU HAD AN ENDOSCOPIC PROCEDURE TODAY AT THE [facility]:   Refer to the procedure report that was given to you for any specific questions about what was found during the examination.  If the procedure report does not answer your questions, please call your gastroenterologist to clarify.  If you requested that your care partner not be given the details of your procedure findings, then the procedure report has been included in a sealed envelope for you to review at your convenience later.  YOU SHOULD EXPECT: Some feelings of bloating in the abdomen. Passage of more gas than usual.  Walking can help get rid of the air that was put into your GI tract during the procedure and reduce the bloating. If you had a lower endoscopy (such as a colonoscopy or flexible sigmoidoscopy) you may notice spotting of blood in your stool or on the toilet paper. If you underwent a bowel prep for your procedure, you may not have a normal bowel movement for a few days.  Please Note:  You might notice some irritation and congestion in your nose or some drainage.  This is from the oxygen used during your procedure.  There is no need for concern and it should clear up in a day or so.  SYMPTOMS TO REPORT IMMEDIATELY:  Following lower endoscopy (colonoscopy or flexible sigmoidoscopy):  Excessive amounts of blood in the stool  Significant tenderness or worsening of abdominal pains  Swelling of the abdomen that is new, acute  Fever of 100F or higher  For urgent or emergent issues, a gastroenterologist can be reached at any hour by calling [phone]. Do not use MyChart messaging for urgent concerns.    DIET:  We do recommend a small meal at first, but then you may proceed to your regular diet.  Drink plenty of fluids but you should avoid alcoholic beverages for 24 hours.  ACTIVITY:  You should plan to take it easy for  the rest of today and you should NOT DRIVE or use heavy machinery until tomorrow (because of the sedation medicines used during the test).    FOLLOW UP: Our staff will call the number listed on your records the next business day following your procedure.  We will call around 7:15- 8:00 am to check on you and address any questions or concerns that you may have regarding the information given to you following your procedure. If we do not reach you, we will leave a message.     If any biopsies were taken you will be contacted by phone or by letter within the next 1-3 weeks.  Please call us at [phone] if you have not heard about the biopsies in 3 weeks.    SIGNATURES/CONFIDENTIALITY: You and/or your care partner have signed paperwork which will be entered into your electronic medical record.  These signatures attest to the fact that that the information above on your After Visit Summary has been reviewed and is understood.  Full responsibility of the confidentiality of this discharge information lies with you and/or your care-partner. 
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
## 2021-11-14 NOTE — Op Note (Signed)
[place] Patient Name: Paul Taylor Procedure Date: 11/14/2021 2:50 PM MRN: [id number] Endoscopist: Nicki Reaper E. Candis Schatz , MD, [id number] Age: 51 Referring MD:  Date of Birth: Jul 06, 1970 Gender: Male Account #: [id number] Procedure:                Colonoscopy Indications:              Screening for colorectal malignant neoplasm, This                            is the patient's first colonoscopy Medicines:                Monitored Anesthesia Care Procedure:                Pre-Anesthesia Assessment:                           - Prior to the procedure, a History and Physical                            was performed, and patient medications and                            allergies were reviewed. The patient's tolerance of                            previous anesthesia was also reviewed. The risks                            and benefits of the procedure and the sedation                            options and risks were discussed with the patient.                            All questions were answered, and informed consent                            was obtained. Prior Anticoagulants: The patient has                            taken no anticoagulant or antiplatelet agents. ASA                            Grade Assessment: II - A patient with mild systemic                            disease. After reviewing the risks and benefits,                            the patient was deemed in satisfactory condition to                            undergo the procedure.  After obtaining informed consent, the colonoscope                            was passed under direct vision. Throughout the                            procedure, the patient's blood pressure, pulse, and                            oxygen saturations were monitored continuously. The                            Olympus CF-HQ190L ([id number]) Colonoscope was                            introduced through  the anus and advanced to the the                            terminal ileum, with identification of the                            appendiceal orifice and IC valve. The colonoscopy                            was performed without difficulty. The patient                            tolerated the procedure well. The quality of the                            bowel preparation was good. The terminal ileum, the                            appendiceal orifice and the rectum were                            photographed. The bowel preparation used was SUPREP                            via split dose instruction. Scope In: 3:05:28 PM Scope Out: 3:27:30 PM Scope Withdrawal Time: 0 hours 18 minutes 24 seconds  Total Procedure Duration: 0 hours 22 minutes 2 seconds  Findings:                 The perianal and digital rectal examinations were                            normal. Pertinent negatives include normal                            sphincter tone and no palpable rectal lesions.                           An 8 mm polyp was found in the ascending colon. The  polyp was sessile. The polyp was removed with a                            cold snare. Resection and retrieval were complete.                            Estimated blood loss was minimal.                           Two sessile polyps were found in the transverse                            colon. The polyps were 2 to 5 mm in size. These                            polyps were removed with a cold snare. Resection                            and retrieval were complete. Estimated blood loss                            was minimal.                           Two sessile polyps were found in the sigmoid colon.                            The polyps were 2 to 3 mm in size. These polyps                            were removed with a cold snare. Resection and                            retrieval were complete. Estimated blood loss was                             minimal.                           The exam was otherwise normal throughout the                            examined colon.                           The terminal ileum appeared normal.                           The retroflexed view of the distal rectum and anal                            verge was normal and showed no anal or rectal  abnormalities. Complications:            No immediate complications. Estimated Blood Loss:     Estimated blood loss was minimal. Impression:               - One 8 mm polyp in the ascending colon, removed                            with a cold snare. Resected and retrieved.                           - Two 2 to 5 mm polyps in the transverse colon,                            removed with a cold snare. Resected and retrieved.                           - Two 2 to 3 mm polyps in the sigmoid colon,                            removed with a cold snare. Resected and retrieved.                           - The examined portion of the ileum was normal.                           - The distal rectum and anal verge are normal on                            retroflexion view. Recommendation:           - Patient has a contact number available for                            emergencies. The signs and symptoms of potential                            delayed complications were discussed with the                            patient. Return to normal activities tomorrow.                            Written discharge instructions were provided to the                            patient.                           - Resume previous diet.                           - Continue present medications.                           - Await  pathology results.                           - Repeat colonoscopy (date not yet determined) for                            surveillance based on pathology results. [name] E. Candis Schatz, MD 11/14/2021 3:35:47  PM This report has been signed electronically.

## 2021-11-14 NOTE — Progress Notes (Signed)
Pt's states no medical or surgical changes since previsit or office visit. 

## 2021-11-14 NOTE — Progress Notes (Signed)
Called to room to assist during endoscopic procedure.  Patient ID and intended procedure confirmed with present staff. Received instructions for my participation in the procedure from the performing physician.  

## 2021-11-15 ENCOUNTER — Telehealth: Payer: Self-pay

## 2021-11-15 NOTE — Telephone Encounter (Signed)
  Follow up Call-     11/14/2021    2:37 PM  Call back number  Post procedure Call Back phone  # [phone]  Permission to leave phone message Yes     Patient questions:  Do you have a fever, pain , or abdominal swelling? No. Pain Score  0 *  Have you tolerated food without any problems? Yes.    Have you been able to return to your normal activities? Yes.    Do you have any questions about your discharge instructions: Diet   No. Medications  No. Follow up visit  No.  Do you have questions or concerns about your Care? No.  Actions: * If pain score is 4 or above: No action needed, pain <4.

## 2021-11-21 ENCOUNTER — Encounter: Payer: Self-pay | Admitting: Gastroenterology

## 2022-01-31 ENCOUNTER — Ambulatory Visit: Payer: 59 | Admitting: Family Medicine

## 2022-02-20 LAB — LAB REPORT - SCANNED
EGFR: 87
PSA, Total: 0.36

## 2022-04-26 ENCOUNTER — Encounter: Payer: Self-pay | Admitting: Family Medicine

## 2022-04-26 ENCOUNTER — Ambulatory Visit: Payer: 59 | Admitting: Family Medicine

## 2022-04-26 VITALS — BP 120/70 | HR 64 | Ht 71.0 in | Wt 214.4 lb

## 2022-04-26 DIAGNOSIS — R7301 Impaired fasting glucose: Secondary | ICD-10-CM | POA: Diagnosis not present

## 2022-04-26 DIAGNOSIS — E041 Nontoxic single thyroid nodule: Secondary | ICD-10-CM | POA: Diagnosis not present

## 2022-04-26 DIAGNOSIS — L608 Other nail disorders: Secondary | ICD-10-CM

## 2022-04-26 DIAGNOSIS — E7800 Pure hypercholesterolemia, unspecified: Secondary | ICD-10-CM | POA: Diagnosis not present

## 2022-04-26 LAB — POCT GLYCOSYLATED HEMOGLOBIN (HGB A1C): Hemoglobin A1C: 5.4 % (ref 4.0–5.6)

## 2022-04-26 NOTE — Patient Instructions (Addendum)
You have elevated fasting sugar, prediabetes. Your A1c was normal. I'd like for you to eat more whole grains (breads should be whole grain/whole wheat, try and have "brown" versions--brown rice, whole grain pasta). Try and limit your carbs per meal--smaller portions of rice and pasta, but also limiting to 1 carb per meal (not having bread with pasta, not having fries with burgers (bun)). Try and eat more vegetables and high fiber diet. Try and drink water instead of soda.  Limit the soda significantly. In general, eating fruit is better than drinking the juice (has more fiber, much less sugar). Keep the orange juice to a very small portion (4 oz juice glass).  Your cholesterol is borderline, but is much better than last year. Continue to limit red meat (beef, pork and lamb); eat more chicken and fish. Eat more plant-based proteins (beans ,lentils, chick peas, tofu). Limit cheese, sour cream, butter, mayonnaise, egg yolks, creamy dressings/sauces (alfredo), soups. Instead have vinaigrette based dressings, broth-based soups.  I suspect the dark area on the bottom of the inner portion of your left great toenail is from trauma. It should either fade with time (might turn more yellow), or it might grow out (it may look the same, but with time move towards the tip of the toe).  It can take a year for a whole new great toenail, but within months you should see the location shift. If it is getting larger and darker instead of either of the options mentioned, then you need to have this re-evaluated (could be an abnormal mole or cancer under the nail). I recommend taking a photo on your phone, and checking it once a week for the next few weeks, or even longer, to look for changes. Come back for a recheck (and bring your phone with the original photos)

## 2022-04-26 NOTE — Progress Notes (Signed)
Chief Complaint  Patient presents with   Consult    Has CPE with [facility] in Feb and would like to go over results with you today.   Patient presents to discuss [facility] lab results, and with toe concern.  Left toe--a couple of weeks ago he noticed a discoloration to the L great toenail. Cannot recall dropping anything on his foot. No change since he first noticed it. Denies pain.  Recent labs:  02/19/2022: TG 198, HDL 49, LDL 132, TG 77ratio 4 Fasting glucose 109, rest of chem normal except T bili 1.4.  Cr 1.04 Sickle cell screen negative CBC: RBC elevated at 6.08, nl Hghg 13.9, Hct 43.3.  MCV 71.2 (low), plt 310 PSA 0.36 EKG bradycardic.  Labs from last year Labs from 02/15/21 were reviewed and explained to patient in detail: Fasting glucose 113, Rest of chem panel was normal. (T bili normal, prev elevated) TC 225, HDL 44, TG 117, LDL 158, chol/HDL ratio 5.1 CBC notable for RBC 5.96, MCV 72.3.  Hg 13.5, WBC 6.1, plt313 PSA 0.48  Impaired fasting glucose-- Current diet includes 1 ginger ale daily, and 1 cup of OJ daily.  Cookie after dinner 3-4 days/week. Has spaghetti, rice, bread and potatoes regularly. He continues to get regular exercise.  Hyperlipidemia--LDL improved from last year. Current diet includes pork and beef 2x/week (1 each).  PMH, PSH, SH reviewed  Outpatient Encounter Medications as of 04/26/2022  Medication Sig Note   tadalafil (CIALIS) 5 MG tablet Take 5 mg by mouth daily as needed. 04/26/2022: As needed   [DISCONTINUED] benzonatate (TESSALON) 200 MG capsule Take 1 capsule (200 mg total) by mouth 3 (three) times daily as needed for cough.    [DISCONTINUED] guaiFENesin-dextromethorphan (ROBITUSSIN DM) 100-10 MG/5ML syrup Take 5 mLs by mouth every 4 (four) hours as needed for cough. 10/19/2021: Last dose 6:30am   No facility-administered encounter medications on file as of 04/26/2022.   Allergies  Allergen Reactions   Penicillins Other (See Comments)    Historical,  told by parent.   ROS: no f/c, URI symptoms, headaches, dizziness, chest pain, shortness of breath, GI complaints. Discoloration of toenail per HPI. ED, meds are effective. See HPI, no other concerns  PHYSICAL EXAM:  BP 120/70   Pulse 64   Ht  (1.803 m)   Wt 214 lb 6.4 oz (97.3 kg)   BMI 29.90 kg/m   Wt Readings from Last 3 Encounters:  04/26/22 214 lb 6.4 oz (97.3 kg)  11/14/21 208 lb (94.3 kg)  11/08/21 208 lb (94.3 kg)   Well-appearing, pleasant male, in no distress Extremities: no edema, 2+ pulses. L great toenail--inferior aspect at the medial base of the nail there is 3 x 4mm hyperpigmented area that is somewhat vague in shape. Nontender. Neuro: alert and oriented, cranial nerves grossly intact, normal gait. Psych: normal mood, affect, hygiene and grooming   Labs reviewed as above.   EKG reviewed--bradycardic, o/w normal.  He also brought in spirometry--poss restriction. Asymptomatic. FEV1% 71% predicted (I don't have priors for comparison).  ASSESSMENT/PLAN:  Impaired fasting glucose - reviewed proper diet, exercise. Cont to monitor through work - Plan: HgB A1c  Hypercholesterolemia - LDL improved. Reviewed low cholesterol diet. Monitored yearly at work.  Toenail deformity - discoloration at L medial great toenail. Suspect bruise; ddx reviewed. To take pic and observe closely. F/u if changing/growing  Thyroid nodule - due for for recheck [place] in June--will check with pt if we need to order vs FD.  I spent 35 minutes dedicated to the care of this patient, including pre-visit review of records, face to face time, post-visit ordering of testing and documentation.

## 2022-04-27 ENCOUNTER — Other Ambulatory Visit: Payer: Self-pay | Admitting: *Deleted

## 2022-04-27 DIAGNOSIS — E041 Nontoxic single thyroid nodule: Secondary | ICD-10-CM

## 2022-04-27 NOTE — Progress Notes (Signed)
Spoke with patient and he states that we would need to order and was a one time thing through his job. Order placed.

## 2022-06-05 ENCOUNTER — Ambulatory Visit
Admission: RE | Admit: 2022-06-05 | Discharge: 2022-06-05 | Disposition: A | Discharge status: Home / Self Care | Payer: 59 | Source: Ambulatory Visit | Attending: Family Medicine | Admitting: Family Medicine

## 2022-06-05 DIAGNOSIS — E041 Nontoxic single thyroid nodule: Secondary | ICD-10-CM

## 2022-06-13 NOTE — Progress Notes (Signed)
Chief Complaint  Patient presents with   Follow-up    Follow up on thyroid ultasound.    Patient presents to discuss his 1 year follow-up thyroid ultrasound that was performed 06/05/22.  IMPRESSION: 1. Abnormal left upper cervical lymph node concerning for possible metastatic malignancy. Recommend further evaluation with contrast-enhanced CT scan of the neck. 2. Unchanged size and appearance of TI-RADS category 4 nodule in the thyroid isthmus. This exam marks 1 year of stability. Recommend continued annual surveillance with follow-up ultrasound annually until 5 years of stability is confirmed.   Denies any URI symptoms.  Not aware of any lumps in the neck. No f/c ,night sweats, unexplained weight loss (intentional weight loss, after recent discussion of IFG).  He continues to feel well, no problems.  He did want to mention that the L great toenail hasn't really changed much since we discussed it at last visit. Denies any pain or h/o trauma.    PMH, PSH, SH reviewed  Outpatient Encounter Medications as of 06/14/2022  Medication Sig Note   tadalafil (CIALIS) 5 MG tablet Take 5 mg by mouth daily as needed. 04/26/2022: As needed   No facility-administered encounter medications on file as of 06/14/2022.   Allergies  Allergen Reactions   Penicillins Other (See Comments)    Historical, told by parent.     ROS: no fever, chills, URI symptoms, headaches, dizziness, chest pain, shortness of breath. No n/v/d, bowel changes, urinary complaints, bleeding, bruising, rash. See HPI    PHYSICAL EXAM:  BP 120/70   Pulse 72   Ht 5\' 11"  (1.803 m)   Wt 208 lb 6.4 oz (94.5 kg)   BMI 29.07 kg/m   Wt Readings from Last 3 Encounters:  06/14/22 208 lb 6.4 oz (94.5 kg)  04/26/22 214 lb 6.4 oz (97.3 kg)  11/14/21 208 lb (94.3 kg)   Well-appearing, pleasant male, in no distress HEENT: conjunctiva and sclera are clear, EOMI.  TM's and EAC's normal.  OP clear, no lesions.  Sinuses  nontender. Neck: no lymphadenopathy, thyromegaly or mass.  No mention in [place] of location of LN (just "left upper")--nothing appreciable on exam today, ant or posteriorly. Heart: regular rate and rhythm Lungs: clear bilaterally Extremities: no edema, normal pulses. Evaluation of L great toenail-- In April, noted to be 3x22mm hyperpigmented area, vague in shape. Today, shape is a little more defined, almost in shape of a V, with slightly yellowish in the superior aspect (top of V).  The color seems more violaceous/purple, not really brown pigment. The length of each edge measures maximally at 5mm. Central width is 2mm. This is located 2-69mm away from the base of the nail (measured from the central portion, not the curved edge where it is located).    ASSESSMENT/PLAN:  Thyroid nodule - of isthmus. Stable on [place].  Needs repeat in 1 year  Cervical lymphadenopathy - noted on recent US.  Not appreciable on today's exam.  no h/o recent illness.  Check CT. Discussed differential/poss f/u - Plan: CT Soft Tissue Neck W Contrast  Toenail deformity - Ddx reviewed. Suspect injury and will grow out (disc timing/appearance). If LN is suspicious, may need to consider melanoma and have biopsied  Bruise vs lesion on L great toenail If abnormal lymph node, may need to have this further evaluated to r/o melanoma If d/t trauma, discussed normal course (fading, and/or growing out, discussed pace of nail growth, what to look for). To let us know if growing/changing in color.

## 2022-06-14 ENCOUNTER — Ambulatory Visit: Payer: 59 | Admitting: Family Medicine

## 2022-06-14 ENCOUNTER — Encounter: Payer: Self-pay | Admitting: Family Medicine

## 2022-06-14 VITALS — BP 120/70 | HR 72 | Ht 71.0 in | Wt 208.4 lb

## 2022-06-14 DIAGNOSIS — R590 Localized enlarged lymph nodes: Secondary | ICD-10-CM | POA: Diagnosis not present

## 2022-06-14 DIAGNOSIS — L608 Other nail disorders: Secondary | ICD-10-CM

## 2022-06-14 DIAGNOSIS — E041 Nontoxic single thyroid nodule: Secondary | ICD-10-CM

## 2022-06-14 NOTE — Patient Instructions (Signed)
We ordered a CT scan to further evaluate the lymph node that was noted on your thyroid ultrasound.   I could not appreciate an enlarged lymph node on today's exam. Your thyroid nodule was stable and should be re-evaluated yearly with ultrasound.  Keep a close eye on the discoloration of the left great toenail.  If it continues to work its way toward the end of the nail, it suggests that there was a trauma that is growing out. If it get darker, larger, or is NOT growing out, we may need to evaluate this further.  Keep an eye on it over the the next month or two.

## 2022-06-15 ENCOUNTER — Encounter: Payer: Self-pay | Admitting: Family Medicine

## 2022-06-15 DIAGNOSIS — R590 Localized enlarged lymph nodes: Secondary | ICD-10-CM | POA: Insufficient documentation

## 2022-07-11 ENCOUNTER — Ambulatory Visit
Admission: RE | Admit: 2022-07-11 | Discharge: 2022-07-11 | Disposition: A | Discharge status: Home / Self Care | Payer: 59 | Source: Ambulatory Visit | Attending: Family Medicine | Admitting: Family Medicine

## 2022-07-11 DIAGNOSIS — R590 Localized enlarged lymph nodes: Secondary | ICD-10-CM

## 2022-07-11 MED ORDER — IOPAMIDOL (ISOVUE-300) INJECTION 61%
75.0000 mL | Freq: Once | INTRAVENOUS | Status: AC | PRN
  Administered 2022-07-11: 75 mL via INTRAVENOUS

## 2023-01-16 IMAGING — US US THYROID
1 series · 13 of 25 positions shown · non-contrast
Comparison: None Available.

CLINICAL DATA: Isthmic nodule on screening study

EXAM:
THYROID ULTRASOUND
TECHNIQUE: Ultrasound examination of the thyroid gland and adjacent soft
tissues was performed.

[Series 1: us thyroid · 0.04mm/px · 39 acquisitions, 13 frames shown]
[im 1/39]
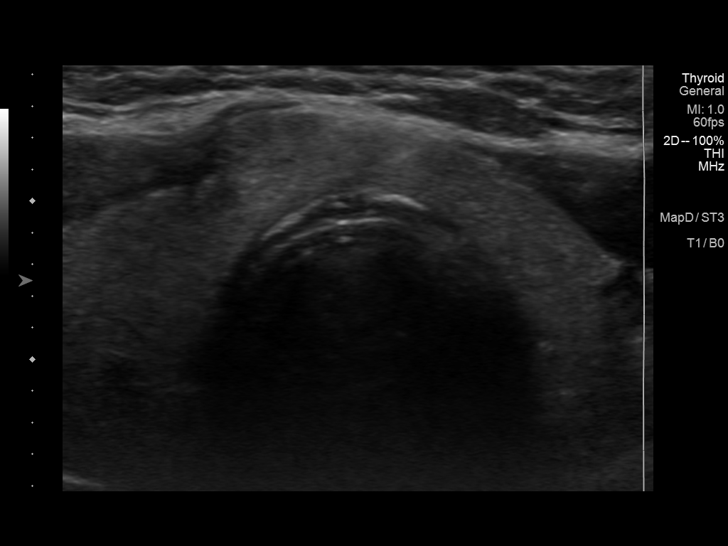
[im 4/39]
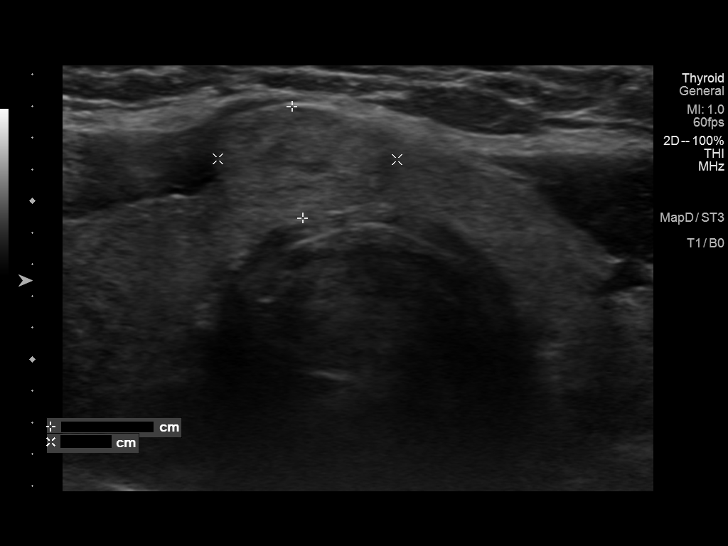
[im 7/39]
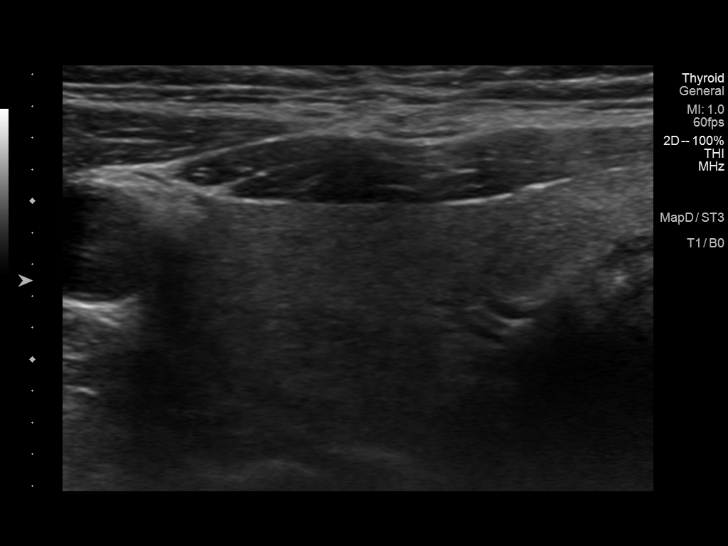
[im 10/39]
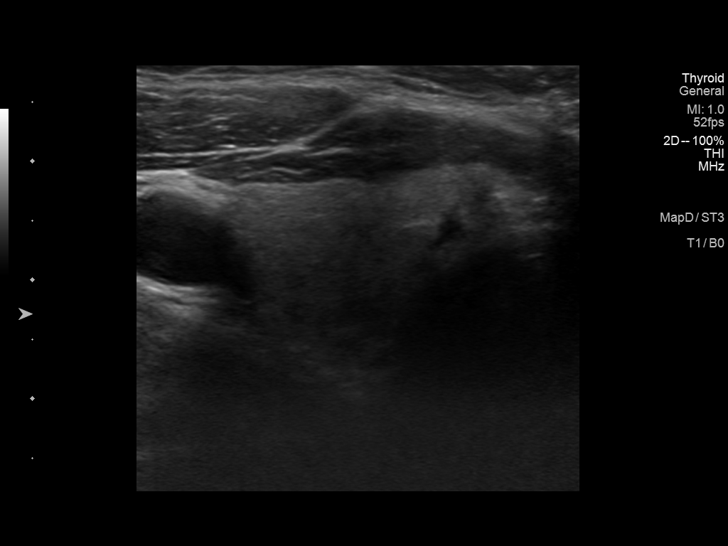
[im 13/39]
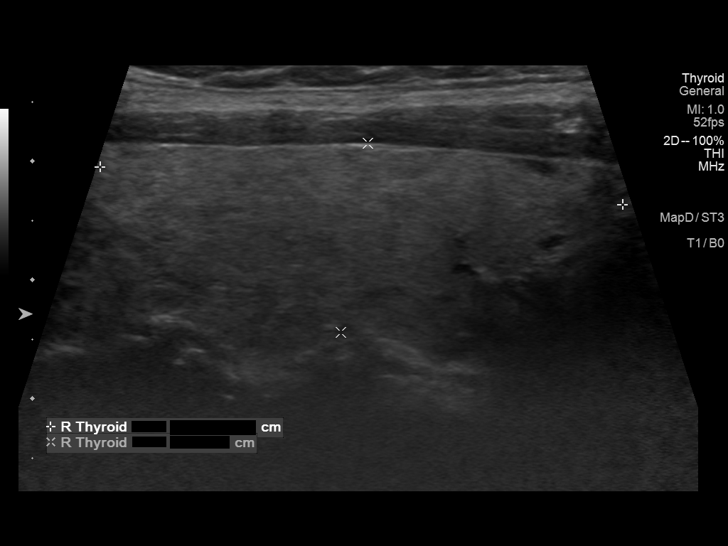
[im 16/39]
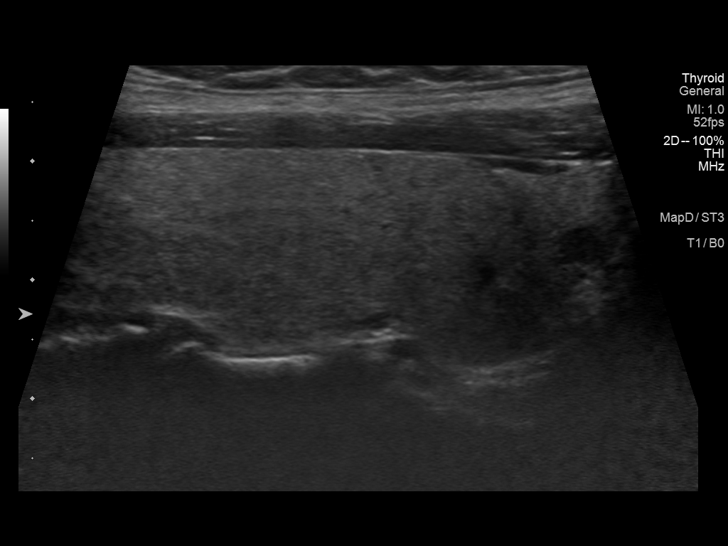
[im 20/39]
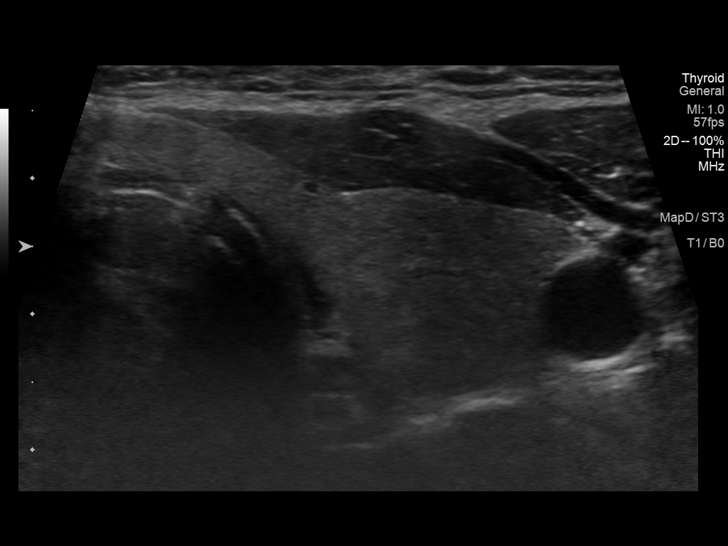
[im 23/39]
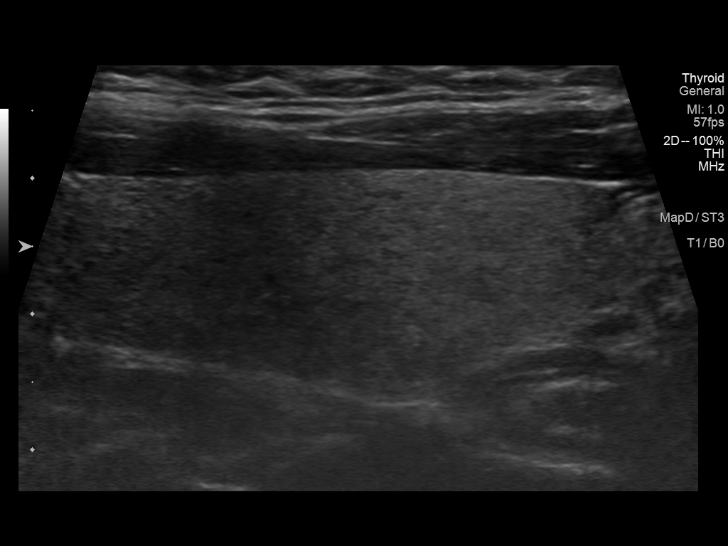
[im 26/39]
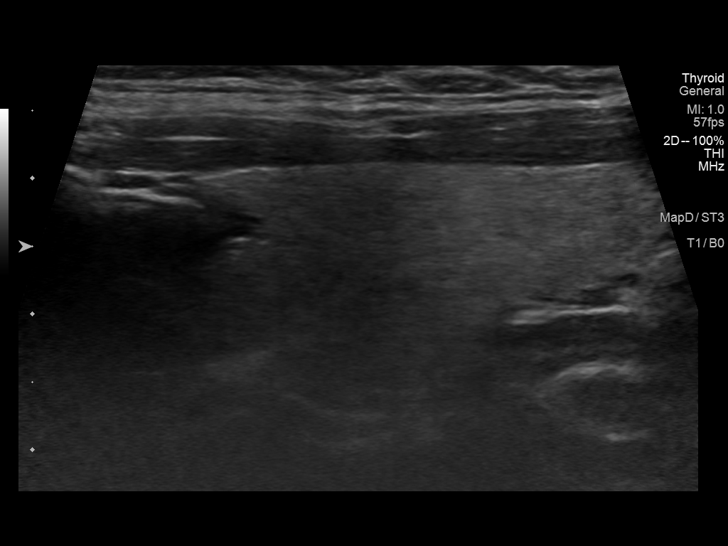
[im 29/39]
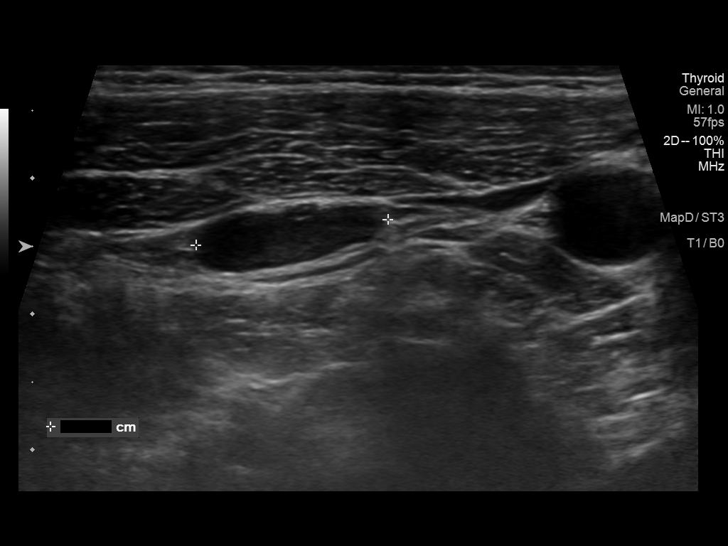
[im 32/39]
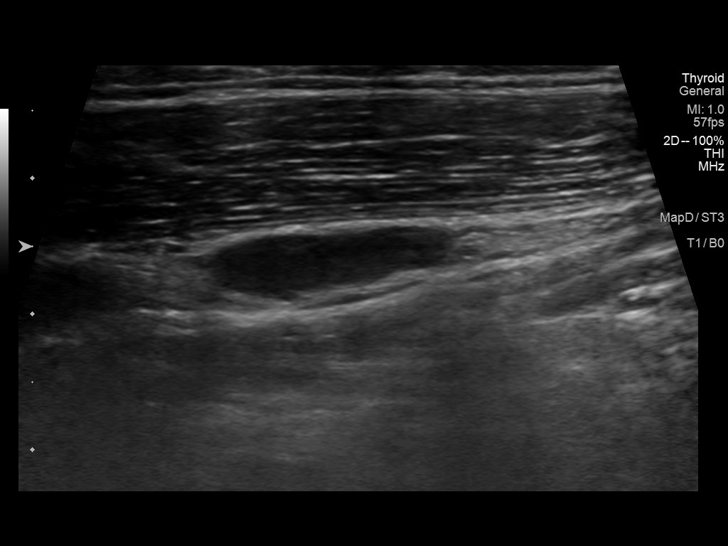
[im 35/39]
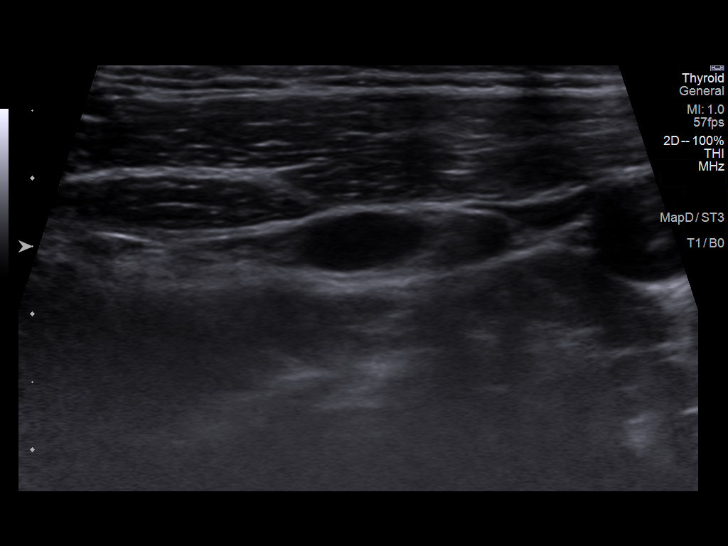
[im 39/39]
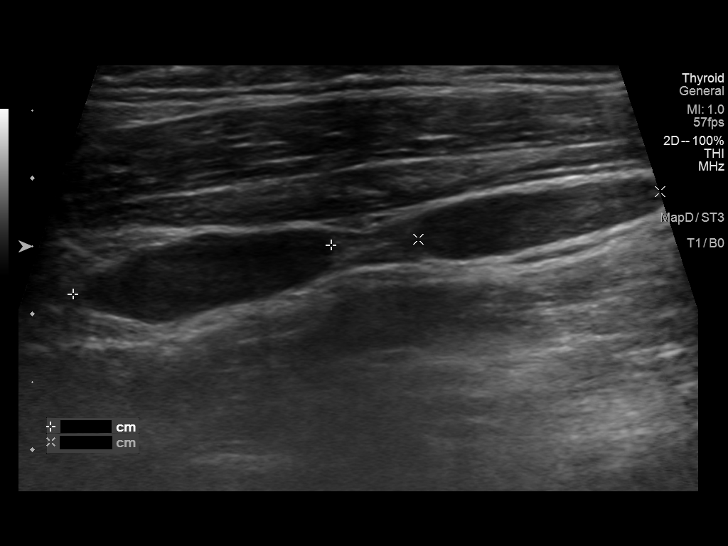

[13 of 25 positions shown; findings below may reference images not displayed]

FINDINGS: Parenchymal Echotexture: Moderately heterogenous

Isthmus: 0.4 cm thickness

Right lobe: 4.4 x 1.6 x 2.2 cm

Left lobe: 4.4 x 1.6 x 1.7 cm

_________________________________________________________

Estimated total number of nodules >/= 1 cm: 1

Number of spongiform nodules >/=  2 cm not described below (TR1): 0

Number of mixed cystic and solid nodules >/= 1.5 cm not described
below (TR2): 0

_________________________________________________________

Nodule # 1:

Location: Isthmus; left of midline

Maximum size: 1.3 cm; Other 2 dimensions:   1.1 x 0.7 cm

Composition: solid/almost completely solid (2)

Echogenicity: isoechoic (1)

Shape: not taller-than-wide (0)

Margins: extra-thyroidal extension (3)

Echogenic foci: none (0)

ACR TI-RADS total points: 6.

ACR TI-RADS risk category: TR4 (4-6 points).

ACR TI-RADS recommendations:

*Given size (>/= 1 - 1.4 cm) and appearance, a follow-up ultrasound
in 1 year should be considered based on TI-RADS criteria.

_________________________________________________________

2 right cervical lymph nodes are incidentally noted measuring up to
5 mm short axis diameter.
IMPRESSION: 1. Solitary isthmic nodule, which does not meet criteria for biopsy.
Recommend annual/biennial ultrasound follow-up as above, until
stability x5 years confirmed.

The above is in keeping with the ACR TI-RADS recommendations - [HOSPITAL] 7027;[DATE].

## 2023-01-31 LAB — LIPID PANEL
Cholesterol: 254 — AB (ref 0–200)
EGFR: 87
HDL: 26 — AB (ref 35–70)
PSA, Total: 0.37
Triglycerides: 0 — AB (ref 40–160)

## 2023-01-31 LAB — BASIC METABOLIC PANEL
Creatinine: 1 (ref 0.6–1.3)
Glucose: 112

## 2023-01-31 LAB — COMPREHENSIVE METABOLIC PANEL: eGFR: 87

## 2023-02-04 NOTE — Progress Notes (Signed)
 Chief Complaint  Patient presents with   Follow-up    Patient is here to discuss labs. Patient was fasting and has not had any recent changes in his diet. Patient is fasting today, last meal 6pm last night.    Patient presents to discuss lab results.  He dropped off labs done 01/30/23, as well as prior labs from 2/024, see below. Labs were significant for TG 1099, HDL 26, TC 254, chol/HDL ratio of 9.8. This is significantly higher than in 02/2022, when TC 198, HDL 49, TG 77, chol/HDL ratio 4.0, LDL 132. Other finding was fasting glucose of 112 (had ben 109 year prior)  He reports he was fasting for those labs. He denies any change in diet. Some chick-fila crispy sandwiches, cut back on fries. Occasionally fried fish.  Fried foods once a week. Has sweet tea daily at lunch, and ginger ale for dinner. He switched to a light orange juice 6 months ago (less sugar).  Denies changes to energy, hair/skin/nails/bowels/energy.  Lab Results  Component Value Date   TSH 0.918 05/17/2021    Labs 01/30/03: Lipids: TC 254, HDL 26, TG 1099, LDL unable to be calculated d/t high TG, chol/HDL ratio 9.8 Fasting glu 887, rest of c-met normal. CBC WBC 6.3, RBC 5.83 (only slightly high, improved), MCV 72.2L, MCH 23.5L, plt 337, RDW slightly high at 15.3.  Absolutely eos slightly low 13 (lower limit nl is 15), rest of diff normal. PSA 0.37  Prior labs, from 02/19/2022: Lipids: TC 198, HDL 49, LDL 132, TG 77, chol/HDL ratio 4.0 Fasting glu 890, Cr 1.04, GFR 87, T bili 1.4, rest of c-met normal. Negative sickle cell screen CBC: WBC 5.9, RBC high at 6.08 (nl up to 5.8), Hgb 13.9, Hct 43.3, plt 310, MCV 71.2 (L), MCH 22.9 (low), normal diff PSA 0.36 EKG 06/04/22 sinus brady (54), otherwise normal.   PMH, PSH, SH reviewed  Outpatient Encounter Medications as of 02/07/2023  Medication Sig Note   tadalafil (CIALIS) 5 MG tablet Take 5 mg by mouth daily as needed. (Patient not taking: Reported on 02/07/2023) 02/07/2023:  As needed   No facility-administered encounter medications on file as of 02/07/2023.   Allergies  Allergen Reactions   Penicillins Other (See Comments)    Historical, told by parent.    ROS: No f/c, URI symptoms, HA, dizziness chest pain, shortness of breath. No n/v/d/constipation, abdominal pain. Skin rashes, bleeding, bruising Ringworm resolved and toe discoloration resolved. He had another stye removed, from R eyelid a few months ago. Had recurrent stye, treated again by ophtho    PHYSICAL EXAM:  BP 128/78   Pulse 88   Ht 5' 11 (1.803 m)   Wt 219 lb 3.2 oz (99.4 kg)   BMI 30.57 kg/m   Wt Readings from Last 3 Encounters:  02/07/23 219 lb 3.2 oz (99.4 kg)  06/14/22 208 lb 6.4 oz (94.5 kg)  04/26/22 214 lb 6.4 oz (97.3 kg)   Pleasant, well appearing male in no distress HEENT: PERRL, EOMI, conjunctiva and sclera are clear, EOMI Neck: No lymphadenopathy or mass Heart: regular rate and rhythm Lungs: clear Abdomen: soft, nontender, no mass Back: no spinal or CVA tenderness Extremities: no edema Psych: normal mood, affect, hygiene and grooming Neuro: alert and oriented, cranial nerves grossly intact, normal gait.   Lab Results  Component Value Date   HGBA1C 5.4 02/07/2023     ASSESSMENT/PLAN:  Hypertriglyceridemia - unclear why he has such a dramatic change in TG and HDL. Repeat labs.  Lowfat diet reviewed. Meds if >300, fish oil if <300. - Plan: Lipid panel  Impaired fasting glucose - educated re: proper diet, exercise - Plan: Glucose, Random, HgB A1c  Dyslipidemia - reviewed lowfat, low cholesterol diet. Recheck lipids - Plan: Lipid panel, TSH  Low HDL (under 40) - unclear why he had such a drop in the last year. Repeating lipid panel. Continue regular exercise - Plan: Lipid panel  Repeat lipids, glu TSH  I spent 32 minutes dedicated to the care of this patient, including pre-visit review of records, face to face time, post-visit ordering of testing and  documentation.   SABRA

## 2023-02-06 ENCOUNTER — Encounter: Payer: Self-pay | Admitting: *Deleted

## 2023-02-07 ENCOUNTER — Encounter: Payer: Self-pay | Admitting: Family Medicine

## 2023-02-07 ENCOUNTER — Ambulatory Visit: Payer: 59 | Admitting: Family Medicine

## 2023-02-07 VITALS — BP 128/78 | HR 88 | Ht 71.0 in | Wt 219.2 lb

## 2023-02-07 DIAGNOSIS — E781 Pure hyperglyceridemia: Secondary | ICD-10-CM | POA: Diagnosis not present

## 2023-02-07 DIAGNOSIS — R7301 Impaired fasting glucose: Secondary | ICD-10-CM | POA: Diagnosis not present

## 2023-02-07 DIAGNOSIS — E786 Lipoprotein deficiency: Secondary | ICD-10-CM

## 2023-02-07 DIAGNOSIS — E785 Hyperlipidemia, unspecified: Secondary | ICD-10-CM

## 2023-02-07 LAB — POCT GLYCOSYLATED HEMOGLOBIN (HGB A1C): Hemoglobin A1C: 5.4 % (ref 4.0–5.6)

## 2023-02-07 NOTE — Patient Instructions (Addendum)
 Please cut out sugary beverages from your diet (ie sweet tea and regular soda). Preferably drink more water or unsweetened seltzer.  Artificial sweeteners aren't particularly healthy, but they don't have sugar or calories.  Paul Taylor would likely be the best option.  I truly don't know what could make your triglycerides jump so high in one year (and the HDL drop so much).  We are going to verify these numbers by repeating the test today. If the triglycerides remain high (over 300) then medications will need to be started. This increases your risk for cardiovascular disease and also pancreatitis.  If your numbers are over 150 (but not high enough to start medications), then I recommend taking omega-3 fish oil 3000-4000 mg daily, as this can help lower triglycerides some.

## 2023-02-08 LAB — LIPID PANEL
Chol/HDL Ratio: 5.3 {ratio} — ABNORMAL HIGH (ref 0.0–5.0)
Cholesterol, Total: 221 mg/dL — ABNORMAL HIGH (ref 100–199)
HDL: 42 mg/dL (ref >39)
LDL Chol Calc (NIH): 163 mg/dL — ABNORMAL HIGH (ref 0–99)
Triglycerides: 87 mg/dL (ref 0–149)
VLDL Cholesterol Cal: 16 mg/dL (ref 5–40)

## 2023-02-08 LAB — GLUCOSE, RANDOM: Glucose: 100 mg/dL — ABNORMAL HIGH (ref 70–99)

## 2023-02-08 LAB — TSH: TSH: 0.796 u[IU]/mL (ref 0.450–4.500)

## 2023-06-03 ENCOUNTER — Other Ambulatory Visit: Payer: Self-pay | Admitting: *Deleted

## 2023-06-03 DIAGNOSIS — E041 Nontoxic single thyroid nodule: Secondary | ICD-10-CM

## 2023-06-10 ENCOUNTER — Ambulatory Visit
Admission: RE | Admit: 2023-06-10 | Discharge: 2023-06-10 | Disposition: A | Discharge status: Home / Self Care | Source: Ambulatory Visit | Attending: Family Medicine | Admitting: Family Medicine

## 2023-06-10 ENCOUNTER — Ambulatory Visit: Payer: Self-pay | Admitting: Family Medicine

## 2023-06-10 DIAGNOSIS — E041 Nontoxic single thyroid nodule: Secondary | ICD-10-CM

## 2023-10-24 ENCOUNTER — Ambulatory Visit: Admitting: Family Medicine

## 2023-10-24 ENCOUNTER — Encounter: Payer: Self-pay | Admitting: Family Medicine

## 2023-10-24 VITALS — BP 118/72 | HR 72 | Temp 97.3°F | Ht 71.0 in | Wt 220.0 lb

## 2023-10-24 DIAGNOSIS — L720 Epidermal cyst: Secondary | ICD-10-CM | POA: Diagnosis not present

## 2023-10-24 DIAGNOSIS — Z7185 Encounter for immunization safety counseling: Secondary | ICD-10-CM

## 2023-10-24 NOTE — Progress Notes (Signed)
 Chief Complaint  Patient presents with   Acne    Has a place on upper right thigh x 5 month-has not changed in size. Not painful or itchy. Scheduled for flu and shingrix tomorror at [facility].     He has a pimple on the right thigh x 5-6 months.  Not painful or itchy. Denies any change in size over the last 5-6 months. He is worried it could be cancer.  He reports he will be getting his flu shot and shingles vaccine tomorrow (first one) from the pharmacy.  He is currently on vacation, with plans to go to the [facility] on Saturday.    PMH, PSH, SH reviewed  Outpatient Encounter Medications as of 10/24/2023  Medication Sig Note   tadalafil (CIALIS) 5 MG tablet Take 5 mg by mouth daily as needed. 10/24/2023: As needed   No facility-administered encounter medications on file as of 10/24/2023.   Allergies  Allergen Reactions   Penicillins Other (See Comments)    Historical, told by parent.    ROS: no f/c, URI symptoms. No other skin rashes or skin concerns. No CP, SOB or other concerns. Currently without any joint pains.    PHYSICAL EXAM:  BP 118/72   Pulse 72   Temp (!) 97.3 F (36.3 C) (Tympanic)   Ht 5' 11 (1.803 m)   Wt 220 lb (99.8 kg)   BMI 30.68 kg/m   Wt Readings from Last 3 Encounters:  10/24/23 220 lb (99.8 kg)  02/07/23 219 lb 3.2 oz (99.4 kg)  06/14/22 208 lb 6.4 oz (94.5 kg)   Pleasant, well-appearing male in no distress HEENT: conjunctiva and sclera are clear, EOMI Psych: normal mood (slightly anxious), affect, hygiene and grooming Neuro: alert and oriented, cranial nerves grossly intact, normal gait.  Skin R upper thigh: 8 x 6 mm soft tissue swelling/subcutaneous mass at the right upper thigh. There is no erythema, fluctuance or pigmentation. This appears consistent with a cyst. No prominent pore is noted (though with squeezing, there are TWO pores which appear more prominent, almost like 2 separate cysts, but no separation is palpable) No  inguinal lymphadenopathy    ASSESSMENT/PLAN:

## 2023-10-24 NOTE — Patient Instructions (Signed)
 Your skin concern appears to be a cyst (I'm not 100% that it is the type of cyst on the handout provided, but similar) You likely can have this for a very long time without any problems. Sometimes they can get infected. You will know this because it will increase in size and becomes painful. If/when this occurs, apply warm compresses.  If it doesn't drain on its own, or if you have a lot of redness or fever, please come to the doctor to get it treated.

## 2024-01-31 ENCOUNTER — Other Ambulatory Visit (HOSPITAL_BASED_OUTPATIENT_CLINIC_OR_DEPARTMENT_OTHER): Payer: Self-pay | Admitting: Family Medicine

## 2024-01-31 DIAGNOSIS — Z8249 Family history of ischemic heart disease and other diseases of the circulatory system: Secondary | ICD-10-CM

## 2024-03-11 ENCOUNTER — Other Ambulatory Visit (HOSPITAL_BASED_OUTPATIENT_CLINIC_OR_DEPARTMENT_OTHER)
